# Patient Record
Sex: Male | Born: 1985 | Race: Black or African American | Hispanic: No | Marital: Married | Smoking: Current every day smoker
Health system: Southern US, Community
[De-identification: ages and names within clinical notes are randomized; demographics above are authoritative.]

## PROBLEM LIST (undated history)

## (undated) DIAGNOSIS — J45909 Unspecified asthma, uncomplicated: Secondary | ICD-10-CM

## (undated) DIAGNOSIS — I10 Essential (primary) hypertension: Secondary | ICD-10-CM

## (undated) HISTORY — PX: CYST EXCISION: SHX5701

---

## 2009-10-15 ENCOUNTER — Emergency Department: Payer: Self-pay | Admitting: Emergency Medicine

## 2014-12-07 ENCOUNTER — Emergency Department: Payer: Self-pay | Admitting: Emergency Medicine

## 2014-12-07 LAB — URINALYSIS, COMPLETE
BACTERIA: NONE SEEN
Bilirubin,UR: NEGATIVE
Glucose,UR: NEGATIVE mg/dL (ref 0–75)
Ketone: NEGATIVE
Leukocyte Esterase: NEGATIVE
NITRITE: NEGATIVE
PH: 6 (ref 4.5–8.0)
Protein: NEGATIVE
RBC,UR: 201 /HPF (ref 0–5)
SPECIFIC GRAVITY: 1.027 (ref 1.003–1.030)
Squamous Epithelial: <1
WBC UR: 1 /HPF (ref 0–5)

## 2015-07-28 ENCOUNTER — Emergency Department
Admission: EM | Admit: 2015-07-28 | Discharge: 2015-07-28 | Disposition: A | Discharge status: Home / Self Care | Payer: Self-pay | Attending: Emergency Medicine | Admitting: Emergency Medicine

## 2015-07-28 ENCOUNTER — Encounter: Payer: Self-pay | Admitting: Emergency Medicine

## 2015-07-28 DIAGNOSIS — L0201 Cutaneous abscess of face: Secondary | ICD-10-CM | POA: Insufficient documentation

## 2015-07-28 DIAGNOSIS — Z720 Tobacco use: Secondary | ICD-10-CM | POA: Insufficient documentation

## 2015-07-28 DIAGNOSIS — L0291 Cutaneous abscess, unspecified: Secondary | ICD-10-CM

## 2015-07-28 HISTORY — DX: Unspecified asthma, uncomplicated: J45.909

## 2015-07-28 MED ORDER — SULFAMETHOXAZOLE-TRIMETHOPRIM 800-160 MG PO TABS: 1.0000 | ORAL_TABLET | Freq: Two times a day (BID) | ORAL | Status: DC

## 2015-07-28 NOTE — ED Provider Notes (Signed)
[facility] Emergency Department Provider Note  ____________________________________________  Time seen: On arrival  I have reviewed the triage vital signs and the nursing notes.   HISTORY  Chief Complaint Abscess    HPI Krista Som is a 29 y.o. male who presents with an abscess on the right cheek. He reports has been there for approximately 2 weeks. Today it ruptured and drained white fluid. He came to get it checked out. He denies fevers chills. He has not been on antibiotics. No intraoral symptoms.    Past Medical History  Diagnosis Date  . Asthma     There are no active problems to display for this patient.   No past surgical history on file.  No current outpatient prescriptions on file.  Allergies Review of patient's allergies indicates no known allergies.  No family history on file.  Social History History  Substance Use Topics  . Smoking status: Current Every Day Smoker  . Smokeless tobacco: Not on file  . Alcohol Use: No    Review of Systems  Constitutional: Negative for fever. Eyes: Negative for visual changes. ENT: Negative for sore throat   Musculoskeletal: Negative for back pain. Skin: Positive for abscess Neurological: Negative for headaches or focal weakness   ____________________________________________   PHYSICAL EXAM:  VITAL SIGNS: ED Triage Vitals  Enc Vitals Group     BP 07/28/15 1052 145/76 mmHg     Pulse Rate 07/28/15 1052 84     Resp 07/28/15 1052 16     Temp 07/28/15 1052 98.7 F (37.1 C)     Temp Source 07/28/15 1052 Oral     SpO2 07/28/15 1052 97 %     Weight 07/28/15 1052 170 lb (77.111 kg)     Height 07/28/15 1052  (1.778 m)     Head Cir --      Peak Flow --      Pain Score 07/28/15 1053 4     Pain Loc --      Pain Edu? --      Excl. in GC? --      Constitutional: Alert and oriented. Well appearing and in no distress. Eyes: Conjunctivae are normal.  ENT   Head:  Normocephalic and atraumatic.   Mouth/Throat: Mucous membranes are moist. No intraoral swelling, normal pharynx Cardiovascular: Normal rate, regular rhythm.  Respiratory: Normal respiratory effort without tachypnea nor retractions.  Gastrointestinal: Soft and non-tender in all quadrants. No distention. There is no CVA tenderness. Musculoskeletal: Nontender with normal range of motion in all extremities. Neurologic:  Normal speech and language. No gross focal neurologic deficits are appreciated. Skin:  Skin is warm, dry and intact. Right cheek shallow abscess with drainage approximately 1 x 2 cm Psychiatric: Mood and affect are normal. Patient exhibits appropriate insight and judgment.  ____________________________________________    LABS (pertinent positives/negatives)  Labs Reviewed - No data to display  ____________________________________________     ____________________________________________    RADIOLOGY I have personally reviewed any xrays that were ordered on this patient: None  ____________________________________________   PROCEDURES  Procedure(s) performed: none   ____________________________________________   INITIAL IMPRESSION / ASSESSMENT AND PLAN / ED COURSE  Pertinent labs & imaging results that were available during my care of the patient were reviewed by me and considered in my medical decision making (see chart for details).  Abscess appears to be draining well. No need for an I&D at this point. We will place patient on antibiotics given some mild surrounding inflammation. Return  precautions discussed  ____________________________________________   FINAL CLINICAL IMPRESSION(S) / ED DIAGNOSES  Final diagnoses:  Abscess     Jene Every, MD 07/28/15 1122

## 2015-07-28 NOTE — Discharge Instructions (Signed)

## 2015-07-28 NOTE — ED Notes (Signed)
Pt reports abscess to right cheek x2 weeks, reports it "bust" today. Pt reports here to get it drained.

## 2015-09-25 ENCOUNTER — Emergency Department
Admission: EM | Admit: 2015-09-25 | Discharge: 2015-09-25 | Disposition: A | Discharge status: Home / Self Care | Payer: Self-pay | Attending: Emergency Medicine | Admitting: Emergency Medicine

## 2015-09-25 ENCOUNTER — Encounter: Payer: Self-pay | Admitting: Emergency Medicine

## 2015-09-25 DIAGNOSIS — Y288XXA Contact with other sharp object, undetermined intent, initial encounter: Secondary | ICD-10-CM | POA: Insufficient documentation

## 2015-09-25 DIAGNOSIS — Y998 Other external cause status: Secondary | ICD-10-CM | POA: Insufficient documentation

## 2015-09-25 DIAGNOSIS — Z79899 Other long term (current) drug therapy: Secondary | ICD-10-CM | POA: Insufficient documentation

## 2015-09-25 DIAGNOSIS — Y9289 Other specified places as the place of occurrence of the external cause: Secondary | ICD-10-CM | POA: Insufficient documentation

## 2015-09-25 DIAGNOSIS — IMO0002 Reserved for concepts with insufficient information to code with codable children: Secondary | ICD-10-CM

## 2015-09-25 DIAGNOSIS — Z720 Tobacco use: Secondary | ICD-10-CM | POA: Insufficient documentation

## 2015-09-25 DIAGNOSIS — Z23 Encounter for immunization: Secondary | ICD-10-CM | POA: Insufficient documentation

## 2015-09-25 DIAGNOSIS — S61217A Laceration without foreign body of left little finger without damage to nail, initial encounter: Secondary | ICD-10-CM | POA: Insufficient documentation

## 2015-09-25 DIAGNOSIS — Y9389 Activity, other specified: Secondary | ICD-10-CM | POA: Insufficient documentation

## 2015-09-25 MED ORDER — BACITRACIN ZINC 500 UNIT/GM EX OINT
TOPICAL_OINTMENT | CUTANEOUS | Status: AC
  Filled 2015-09-25: qty 0.9

## 2015-09-25 MED ORDER — IBUPROFEN 800 MG PO TABS: 800.0000 mg | ORAL_TABLET | Freq: Three times a day (TID) | ORAL | Status: DC | PRN

## 2015-09-25 MED ORDER — BACITRACIN ZINC 500 UNIT/GM EX OINT: TOPICAL_OINTMENT | Freq: Two times a day (BID) | CUTANEOUS | Status: DC

## 2015-09-25 MED ORDER — TETANUS-DIPHTH-ACELL PERTUSSIS 5-2.5-18.5 LF-MCG/0.5 IM SUSP
0.5000 mL | Freq: Once | INTRAMUSCULAR | Status: AC
  Administered 2015-09-25: 0.5 mL via INTRAMUSCULAR
  Filled 2015-09-25: qty 0.5

## 2015-09-25 MED ORDER — IBUPROFEN 800 MG PO TABS
800.0000 mg | ORAL_TABLET | Freq: Once | ORAL | Status: AC
  Administered 2015-09-25: 800 mg via ORAL
  Filled 2015-09-25: qty 1

## 2015-09-25 NOTE — Discharge Instructions (Signed)
Laceration Care, Adult A laceration is a cut that goes through all layers of the skin. The cut goes into the tissue beneath the skin. HOME CARE For stitches (sutures) or staples:  Keep the cut clean and dry.  If you have a bandage (dressing), change it at least once a day. Change the bandage if it gets wet or dirty, or as told by your doctor.  Wash the cut with soap and water 2 times a day. Rinse the cut with water. Pat it dry with a clean towel.  Put a thin layer of medicated cream on the cut as told by your doctor.  You may shower after the first 24 hours. Do not soak the cut in water until the stitches are removed.  Only take medicines as told by your doctor.  Have your stitches or staples removed as told by your doctor. For skin adhesive strips:  Keep the cut clean and dry.  Do not get the strips wet. You may take a bath, but be careful to keep the cut dry.  If the cut gets wet, pat it dry with a clean towel.  The strips will fall off on their own. Do not remove the strips that are still stuck to the cut. For wound glue:  You may shower or take baths. Do not soak or scrub the cut. Do not swim. Avoid heavy sweating until the glue falls off on its own. After a shower or bath, pat the cut dry with a clean towel.  Do not put medicine on your cut until the glue falls off.  If you have a bandage, do not put tape over the glue.  Avoid lots of sunlight or tanning lamps until the glue falls off. Put sunscreen on the cut for the first year to reduce your scar.  The glue will fall off on its own. Do not pick at the glue. You may need a tetanus shot if:  You cannot remember when you had your last tetanus shot.  You have never had a tetanus shot. If you need a tetanus shot and you choose not to have one, you may get tetanus. Sickness from tetanus can be serious. GET HELP RIGHT AWAY IF:   Your pain does not get better with medicine.  Your arm, hand, leg, or foot loses feeling  (numbness) or changes color.  Your cut is bleeding.  Your joint feels weak, or you cannot use your joint.  You have painful lumps on your body.  Your cut is red, puffy (swollen), or painful.  You have a red line on the skin near the cut.  You have yellowish-white fluid (pus) coming from the cut.  You have a fever.  You have a bad smell coming from the cut or bandage.  Your cut breaks open before or after stitches are removed.  You notice something coming out of the cut, such as wood or glass.  You cannot move a finger or toe. MAKE SURE YOU:   Understand these instructions.  Will watch your condition.  Will get help right away if you are not doing well or get worse. Document Released: 05/31/2008 Document Revised: 03/06/2012 Document Reviewed: 06/08/2011 [facility] Patient Information 2015 [place], [place]. This information is not intended to replace advice given to you by your health care provider. Make sure you discuss any questions you have with your health care provider.   Pain medicine as needed. Watch for signs of infection. The area clean and protected. Return in approximately  10-12 days for suture removal. return sooner for any concerns.

## 2015-09-25 NOTE — ED Provider Notes (Signed)
[facility] Emergency Department Provider Note  ____________________________________________  Time seen: Approximately 5:39 PM  I have reviewed the triage vital signs and the nursing notes.   HISTORY  Chief Complaint Laceration    HPI Joel Waller is a 29 y.o. male who presents with a laceration to the base of the left fifth finger. Cut the finger with a piece of metal. he is not remember his last tetanus shot. No loss of function to the left fifth finger. He has stopped.   Past Medical History  Diagnosis Date  . Asthma     There are no active problems to display for this patient.   History reviewed. No pertinent past surgical history.  Current Outpatient Rx  Name  Route  Sig  Dispense  Refill  . ibuprofen (ADVIL,MOTRIN) 800 MG tablet   Oral   Take 1 tablet (800 mg total) by mouth every 8 (eight) hours as needed.   15 tablet   0   . sulfamethoxazole-trimethoprim (BACTRIM DS,SEPTRA DS) 800-160 MG per tablet   Oral   Take 1 tablet by mouth 2 (two) times daily.   14 tablet   0     Allergies Review of patient's allergies indicates no known allergies.  No family history on file.  Social History Social History  Substance Use Topics  . Smoking status: Current Every Day Smoker  . Smokeless tobacco: None  . Alcohol Use: No    Review of Systems Constitutional: No fever/chills Eyes: No visual changes. ENT: No sore throat. Cardiovascular: Denies chest pain. Respiratory: Denies shortness of breath.10-point ROS otherwise negative.  ____________________________________________   PHYSICAL EXAM:  VITAL SIGNS: ED Triage Vitals  Enc Vitals Group     BP 09/25/15 1655 162/89 mmHg     Pulse Rate 09/25/15 1655 69     Resp 09/25/15 1655 20     Temp 09/25/15 1655 97.8 F (36.6 C)     Temp Source 09/25/15 1655 Oral     SpO2 09/25/15 1655 98 %     Weight 09/25/15 1655 180 lb (81.647 kg)     Height 09/25/15 1655  (1.778 m)     Head  Cir --      Peak Flow --      Pain Score 09/25/15 1651 4     Pain Loc --      Pain Edu? --      Excl. in GC? --     Constitutional: Alert and oriented. Well appearing and in no acute distress. Eyes: Conjunctivae are normal. EOMI. Head: Atraumatic. Neck: supple Cardiovascular: Normal rate, regular rhythm. Grossly normal heart sounds.  Good peripheral circulation. Respiratory: Normal respiratory effort.  No retractions. Lungs CTAB. Skin:  Skin is warm, dry and intact. No rash noted. One centimeter laceration to the proximal surface of the left fifth finger. Psychiatric: Mood and affect are normal. Speech and behavior are normal.  ____________________________________________   LABS (all labs ordered are listed, but only abnormal results are displayed)  Labs Reviewed - No data to display ____________________________________________  EKG    ____________________________________________  RADIOLOGY    ____________________________________________   PROCEDURES  Procedure(s) performed: LACERATION REPAIR Performed by: Ignacia Bayley Authorized by: Ignacia Bayley Consent: Verbal consent obtained. Risks and benefits: risks, benefits and alternatives were discussed Consent given by: patient Patient identity confirmed: provided demographic data Prepped and Draped in normal sterile fashion Wound explored  Laceration Location: The fifth  Laceration Length: 1cm  No Foreign Bodies seen or palpated  Anesthesia: local infiltration  Local anesthetic: lidocaine 1 % with epinephrine  Anesthetic total: 2 ml  Irrigation method: syringe Amount of cleaning: standard  Skin closure: 4.0 nylon  Number of sutures: 3  Technique: simple interrupted  Patient tolerance: Patient tolerated the procedure well with no immediate complications.   Critical Care performed: No  ____________________________________________   INITIAL IMPRESSION / ASSESSMENT AND PLAN / ED  COURSE  Pertinent labs & imaging results that were available during my care of the patient were reviewed by me and considered in my medical decision making (see chart for details).  29 year old male with history of laceration to the left fifth finger. Closure as per procedure above. Tolerated well. Will return in 10-12 days for suture removal. Instructed in laceration care ____________________________________________   FINAL CLINICAL IMPRESSION(S) / ED DIAGNOSES  Final diagnoses:  Laceration   .  Ignacia Bayley, PA-C 09/25/15 1745  Ignacia Bayley, PA-C 09/25/15 1856  Phineas Semen, MD 09/25/15 2059

## 2015-09-25 NOTE — ED Notes (Signed)
Laceration to left 5th finger 

## 2017-12-20 ENCOUNTER — Encounter: Payer: Self-pay | Admitting: Emergency Medicine

## 2017-12-20 ENCOUNTER — Other Ambulatory Visit: Payer: Self-pay

## 2017-12-20 ENCOUNTER — Emergency Department
Admission: EM | Admit: 2017-12-20 | Discharge: 2017-12-20 | Disposition: A | Discharge status: Home / Self Care | Payer: Self-pay | Attending: Emergency Medicine | Admitting: Emergency Medicine

## 2017-12-20 DIAGNOSIS — F17200 Nicotine dependence, unspecified, uncomplicated: Secondary | ICD-10-CM | POA: Insufficient documentation

## 2017-12-20 DIAGNOSIS — J45909 Unspecified asthma, uncomplicated: Secondary | ICD-10-CM | POA: Insufficient documentation

## 2017-12-20 DIAGNOSIS — J111 Influenza due to unidentified influenza virus with other respiratory manifestations: Secondary | ICD-10-CM | POA: Insufficient documentation

## 2017-12-20 DIAGNOSIS — Z79899 Other long term (current) drug therapy: Secondary | ICD-10-CM | POA: Insufficient documentation

## 2017-12-20 DIAGNOSIS — I10 Essential (primary) hypertension: Secondary | ICD-10-CM | POA: Insufficient documentation

## 2017-12-20 HISTORY — DX: Essential (primary) hypertension: I10

## 2017-12-20 LAB — INFLUENZA PANEL BY PCR (TYPE A & B)
INFLAPCR: POSITIVE — AB
Influenza B By PCR: NEGATIVE

## 2017-12-20 MED ORDER — ACETAMINOPHEN 325 MG PO TABS
650.0000 mg | ORAL_TABLET | Freq: Once | ORAL | Status: AC | PRN
  Administered 2017-12-20: 650 mg via ORAL
  Filled 2017-12-20: qty 2

## 2017-12-20 MED ORDER — ACETAMINOPHEN-CODEINE #3 300-30 MG PO TABS: 1.0000 | ORAL_TABLET | Freq: Four times a day (QID) | ORAL | 0 refills | Status: DC | PRN

## 2017-12-20 MED ORDER — BENZONATATE 100 MG PO CAPS: ORAL_CAPSULE | ORAL | 0 refills | Status: DC

## 2017-12-20 MED ORDER — FLUTICASONE PROPIONATE 50 MCG/ACT NA SUSP: 2.0000 | Freq: Every day | NASAL | 0 refills | Status: DC

## 2017-12-20 NOTE — Discharge Instructions (Signed)
You have tested positive for influenza (flu). Continue to monitor and treat any fevers with Tylenol and ibuprofen. Drink plenty of fluids, even if not eating much, to prevent dehydration. Follow-up with your provider or return for chest pain or shortness of breath.

## 2017-12-20 NOTE — ED Triage Notes (Signed)
Arrives with c/o sinus congestion, body aches, fever x 1 day.  Has not medicated for fever.

## 2017-12-20 NOTE — ED Provider Notes (Signed)
[facility] Emergency Department Provider Note ____________________________________________  Time seen: 1130  I have reviewed the triage vital signs and the nursing notes.  HISTORY  Chief Complaint  Fever  HPI Joel Waller is a 31 y.o. male presents to the ED accompanied by his friend for evaluation of sudden onset of fevers, cough, congestion.  Patient reports symptoms that began yesterday.  He has not any medication in the interim for symptom relief.  He also has not received the seasonal flu vaccine.  He reports generalized body aches, sinus congestion, and cough.  He denies any nausea, vomiting, chest pain, or diarrhea.  Past Medical History:  Diagnosis Date  . Asthma   . Hypertension     There are no active problems to display for this patient.   History reviewed. No pertinent surgical history.  Prior to Admission medications   Medication Sig Start Date End Date Taking? Authorizing Provider  acetaminophen-codeine (TYLENOL #3) 300-30 MG tablet Take 1 tablet by mouth every 6 (six) hours as needed for moderate pain. 12/20/17   Aylyn Wenzler, Charlesetta IvoryJenise V Bacon, PA-C  benzonatate (TESSALON PERLES) 100 MG capsule Take 1-2 tabs TID prn cough 12/20/17   Chamari Cutbirth, Charlesetta IvoryJenise V Bacon, PA-C  fluticasone (FLONASE) 50 MCG/ACT nasal spray Place 2 sprays into both nostrils daily. 12/20/17   Cathren Sween, Charlesetta IvoryJenise V Bacon, PA-C  ibuprofen (ADVIL,MOTRIN) 800 MG tablet Take 1 tablet (800 mg total) by mouth every 8 (eight) hours as needed. 09/25/15   Ignacia Bayleyumey, Robert, PA-C  sulfamethoxazole-trimethoprim (BACTRIM DS,SEPTRA DS) 800-160 MG per tablet Take 1 tablet by mouth 2 (two) times daily. 07/28/15   Jene EveryKinner, Robert, MD    Allergies Patient has no known allergies.  No family history on file.  Social History Social History   Tobacco Use  . Smoking status: Current Every Day Smoker  . Smokeless tobacco: Never Used  Substance Use Topics  . Alcohol use: No  . Drug use: No    Review of  Systems  Constitutional: Positive for fever. Eyes: Negative for visual changes. ENT: Negative for sore throat.  Reports sinus congestion. Cardiovascular: Negative for chest pain. Respiratory: Negative for shortness of breath. Gastrointestinal: Negative for abdominal pain, vomiting and diarrhea. Genitourinary: Negative for dysuria. Musculoskeletal: Negative for back pain.  Reports generalized body aches. Skin: Negative for rash. Neurological: Negative for headaches, focal weakness or numbness. ____________________________________________  PHYSICAL EXAM:  VITAL SIGNS: ED Triage Vitals  Enc Vitals Group     BP 12/20/17 1132 (!) 157/86     Pulse Rate 12/20/17 1132 (!) 114     Resp 12/20/17 1132 (!) 22     Temp 12/20/17 1132 (!) 102.3 F (39.1 C)     Temp Source 12/20/17 1132 Oral     SpO2 12/20/17 1132 98 %     Weight 12/20/17 1121 176 lb (79.8 kg)     Height 12/20/17 1121 5\' 10"  (1.778 m)     Head Circumference --      Peak Flow --      Pain Score 12/20/17 1121 8     Pain Loc --      Pain Edu? --      Excl. in GC? --     Constitutional: Alert and oriented. Well appearing and in no distress. Head: Normocephalic and atraumatic. Eyes: Conjunctivae are normal. PERRL. Normal extraocular movements Ears: Canals clear. TMs intact bilaterally. Nose: No congestion/rhinorrhea/epistaxis. Mouth/Throat: Mucous membranes are moist. Neck: Supple. No thyromegaly. Hematological/Lymphatic/Immunological: No cervical lymphadenopathy. Cardiovascular: Normal rate, regular rhythm.  Normal distal pulses. Respiratory: Normal respiratory effort. No wheezes/rales/rhonchi. Gastrointestinal: Soft and nontender. No distention. Musculoskeletal: Nontender with normal range of motion in all extremities.  Neurologic:  Normal gait without ataxia. Normal speech and language. No gross focal neurologic deficits are appreciated. ____________________________________________   LABS (pertinent  positives/negatives)  Labs Reviewed  INFLUENZA PANEL BY PCR (TYPE A & B) - Abnormal; Notable for the following components:      Result Value   Influenza A By PCR POSITIVE (*)    All other components within normal limits  ____________________________________________  PROCEDURES  Procedures Tylenol 650 mg PO ____________________________________________  INITIAL IMPRESSION / ASSESSMENT AND PLAN / ED COURSE  ED evaluation of sudden onset of fevers and body aches.  His lab is consistent with an influenza A.  He is discharged with prescriptions for Tessalon Perles, Flonase, and Tylenol with codeine.  He will continue to monitor and treat fevers as well as hydrate to prevent dehydration.  Return precautions have been reviewed. ____________________________________________  FINAL CLINICAL IMPRESSION(S) / ED DIAGNOSES  Final diagnoses:  Influenza      Karmen StabsMenshew, Charlesetta IvoryJenise V Bacon, PA-C 12/20/17 1245    Myrna BlazerSchaevitz, David Matthew, MD 12/20/17 [phone]

## 2018-01-07 ENCOUNTER — Emergency Department
Admission: EM | Admit: 2018-01-07 | Discharge: 2018-01-07 | Disposition: A | Discharge status: Home / Self Care | Payer: Self-pay | Attending: Emergency Medicine | Admitting: Emergency Medicine

## 2018-01-07 ENCOUNTER — Encounter: Payer: Self-pay | Admitting: Emergency Medicine

## 2018-01-07 ENCOUNTER — Other Ambulatory Visit: Payer: Self-pay

## 2018-01-07 DIAGNOSIS — J45909 Unspecified asthma, uncomplicated: Secondary | ICD-10-CM | POA: Insufficient documentation

## 2018-01-07 DIAGNOSIS — K047 Periapical abscess without sinus: Secondary | ICD-10-CM | POA: Insufficient documentation

## 2018-01-07 DIAGNOSIS — Z79899 Other long term (current) drug therapy: Secondary | ICD-10-CM | POA: Insufficient documentation

## 2018-01-07 DIAGNOSIS — I10 Essential (primary) hypertension: Secondary | ICD-10-CM | POA: Insufficient documentation

## 2018-01-07 DIAGNOSIS — F17200 Nicotine dependence, unspecified, uncomplicated: Secondary | ICD-10-CM | POA: Insufficient documentation

## 2018-01-07 MED ORDER — AMOXICILLIN 500 MG PO CAPS: 500.0000 mg | ORAL_CAPSULE | Freq: Three times a day (TID) | ORAL | 0 refills | Status: DC

## 2018-01-07 MED ORDER — IBUPROFEN 800 MG PO TABS: 800.0000 mg | ORAL_TABLET | Freq: Three times a day (TID) | ORAL | 0 refills | Status: DC | PRN

## 2018-01-07 MED ORDER — TRAMADOL HCL 50 MG PO TABS: 50.0000 mg | ORAL_TABLET | Freq: Four times a day (QID) | ORAL | 0 refills | Status: DC | PRN

## 2018-01-07 NOTE — Discharge Instructions (Signed)
Follow-up from his dental clinics. OPTIONS FOR DENTAL FOLLOW UP CARE  [place] Department of Health and Human Services - Local Safety Net Dental Clinics [email or web address]://www.ncdhhs.gov/dph/oralhealth/services/safetynetclinics.htm   [facility] [phone])  [facility] [phone])  [facility] [phone])  [facility] [phone])  [facility] [phone]) This clinic caters to the indigent population and is on a lottery system. Location: [employer], [address], [address], [place] Clinic Hours: Wednesdays from 6pm - 9pm, patients seen by a lottery system. For dates, call or go to [email or web address] Services: Cleanings, fillings and simple extractions. Payment Options: DENTAL WORK IS FREE OF CHARGE. Bring proof of income or support. Best way to get seen: Arrive at 5:15 pm - this is a lottery, NOT first come/first serve, so arriving earlier will not increase your chances of being seen.     [facility] [phone] Select option 1 for emergencies   Location: [facility], [address], [address], [place] [facility] Hours: No walk-ins accepted - call the day before to schedule an appointment. Check in times are 9:30 am and 1:30 pm. Services: Simple extractions, temporary fillings, pulpectomy/pulp debridement, uncomplicated abscess drainage. Payment Options: PAYMENT IS DUE AT THE TIME OF SERVICE.  Fee is usually $100-200, additional surgical procedures (e.g. abscess drainage) may be extra. Cash, checks, Visa/MasterCard accepted.  Can file Medicaid if patient is covered for dental - patient should call case worker to check. No discount for [facility] patients. Best way to get seen: MUST call the day before and get onto the schedule. Can usually be seen the next 1-2 days. No walk-ins accepted.     [facility] [phone]   Location: [facility], [address], [place] Clinic Hours: M, W, Th, F 8am or 1:30pm, Tues 9a or 1:30 - first come/first served. Services: Simple extractions, temporary fillings, uncomplicated abscess drainage.  You do not need to be an [place] resident. Payment Options: PAYMENT IS DUE AT THE TIME OF SERVICE. Dental insurance, otherwise sliding scale - bring proof of income or support. Depending on income and treatment needed, cost is usually $50-200. Best way to get seen: Arrive early as it is first come/first served.     [facility] [phone]   Location: [address] Clinic Hours: Mon-Thu 8a-5p Services: Most basic dental services including extractions and fillings. Payment Options: PAYMENT IS DUE AT THE TIME OF SERVICE. Sliding scale, up to 50% off - bring proof if income or support. Medicaid with dental option accepted. Best way to get seen: Call to schedule an appointment, can usually be seen within 2 weeks OR they will try to see walk-ins - show up at 8a or 2p (you may have to wait).     [facility] [phone] [place] RESIDENTS ONLY   Location: [facility], [address], [place], [place] [address] Clinic Hours: By appointment only. Monday - Thursday 8am-5pm, Friday 8am-12pm Services: Cleanings, fillings, extractions. Payment Options: PAYMENT IS DUE AT THE TIME OF SERVICE. Cash, Visa or MasterCard. Sliding scale - $30 minimum per service. Best way to get seen: Come in to office, complete packet and make an appointment - need proof of income or support monies for each household member and proof of [place] residence. Usually takes about a month to get in.     [facility] [phone]  Location: [address], [place] Clinic Hours: Walk-in Urgent Care Dental Services are  offered Monday-Friday mornings only. The numbers of emergencies accepted daily is limited to the number of providers available. Maximum 15 - Mondays, Wednesdays & Thursdays Maximum 10 - Tuesdays & Fridays Services: You do not need to be a [facility] resident to be seen for a dental emergency. Emergencies are defined as pain, swelling, abnormal bleeding, or dental trauma. Walkins will receive x-rays if needed. NOTE: Dental cleaning is not an emergency. Payment Options: PAYMENT IS DUE AT THE TIME OF SERVICE. Minimum co-pay is $40.00 for uninsured patients. Minimum co-pay is $3.00 for Medicaid with dental coverage. Dental Insurance is accepted and must be presented at time of visit. Medicare does not cover dental. Forms of payment: Cash, credit card, checks. Best way to get seen: If not previously registered with the clinic, walk-in dental registration begins at 7:15 am and is on a first come/first serve basis. If previously registered with the clinic, call to make an appointment.     [facility] [place] ONLY   Location: [address], [place], [place] Clinic Hours: Mon-Thu 10a-2p Services: Extractions only! Payment Options: FREE (donations accepted) - bring proof of income or support Best way to get seen: Call and schedule an appointment OR come at 8am on the 1st Monday of every month (except for holidays) when it is first come/first served.     [facility] [phone]   Location: [place], [place] Clinic Hours: Friday mornings Services, Payment Options, Best way to get seen: Call for info

## 2018-01-07 NOTE — ED Provider Notes (Signed)
[facility] Emergency Department Provider Note   ____________________________________________   First MD Initiated Contact with Patient 01/07/18 1016     (approximate)  I have reviewed the triage vital signs and the nursing notes.   HISTORY  Chief Complaint Abscess    HPI Joel Waller is a 32 y.o. male patient complained of dental pain for 2-3 days.  Patient awakened this morning with left lateral facial edema.  Patient rates his pain as a 6/10.  Patient described the pain as "achy".  No palliative measure for complaints.  Past Medical History:  Diagnosis Date  . Asthma   . Hypertension     There are no active problems to display for this patient.   History reviewed. No pertinent surgical history.  Prior to Admission medications   Medication Sig Start Date End Date Taking? Authorizing Provider  acetaminophen-codeine (TYLENOL #3) 300-30 MG tablet Take 1 tablet by mouth every 6 (six) hours as needed for moderate pain. 12/20/17   Menshew, Charlesetta Ivory, PA-C  amoxicillin (AMOXIL) 500 MG capsule Take 1 capsule (500 mg total) by mouth 3 (three) times daily. 01/07/18   Joni Reining, PA-C  benzonatate (TESSALON PERLES) 100 MG capsule Take 1-2 tabs TID prn cough 12/20/17   Menshew, Charlesetta Ivory, PA-C  fluticasone (FLONASE) 50 MCG/ACT nasal spray Place 2 sprays into both nostrils daily. 12/20/17   Menshew, Charlesetta Ivory, PA-C  ibuprofen (ADVIL,MOTRIN) 800 MG tablet Take 1 tablet (800 mg total) by mouth every 8 (eight) hours as needed. 09/25/15   Ignacia Bayley, PA-C  ibuprofen (ADVIL,MOTRIN) 800 MG tablet Take 1 tablet (800 mg total) by mouth every 8 (eight) hours as needed. 01/07/18   Joni Reining, PA-C  sulfamethoxazole-trimethoprim (BACTRIM DS,SEPTRA DS) 800-160 MG per tablet Take 1 tablet by mouth 2 (two) times daily. 07/28/15   Jene Every, MD  traMADol (ULTRAM) 50 MG tablet Take 1 tablet (50 mg total) by mouth every 6 (six) hours as needed for  moderate pain. 01/07/18   Joni Reining, PA-C    Allergies Patient has no known allergies.  No family history on file.  Social History Social History   Tobacco Use  . Smoking status: Current Every Day Smoker  . Smokeless tobacco: Never Used  Substance Use Topics  . Alcohol use: No  . Drug use: No    Review of Systems Constitutional: No fever/chills Eyes: No visual changes. ENT: No sore throat.  The pain Cardiovascular: Denies chest pain. Respiratory: Denies shortness of breath. Gastrointestinal: No abdominal pain.  No nausea, no vomiting.  No diarrhea.  No constipation. Genitourinary: Negative for dysuria. Musculoskeletal: Negative for back pain. Skin: Negative for rash. Neurological: Negative for headaches, focal weakness or numbness. Endocrine:Hypertension ____________________________________________   PHYSICAL EXAM:  VITAL SIGNS: ED Triage Vitals  Enc Vitals Group     BP 01/07/18 1013 (!) 142/88     Pulse Rate 01/07/18 1013 68     Resp 01/07/18 1013 18     Temp 01/07/18 1013 98.6 F (37 C)     Temp Source 01/07/18 1013 Oral     SpO2 01/07/18 1013 100 %     Weight 01/07/18 1005 176 lb (79.8 kg)     Height --      Head Circumference --      Peak Flow --      Pain Score 01/07/18 1005 8     Pain Loc --      Pain Edu? --  Excl. in GC? --    Constitutional: Alert and oriented. Well appearing and in no acute distress. Cardiovascular: Normal rate, regular rhythm. Grossly normal heart sounds.  Good peripheral circulation. Respiratory: Normal respiratory effort.  No retractions. Lungs CTAB. Musculoskeletal: No lower extremity tenderness nor edema.  No joint effusions. Neurologic:  Normal speech and language. No gross focal neurologic deficits are appreciated. No gait instability. Skin:  Skin is warm, dry and intact. No rash noted. Psychiatric: Mood and affect are normal. Speech and behavior are normal.  ____________________________________________     LABS (all labs ordered are listed, but only abnormal results are displayed)  Labs Reviewed - No data to display ____________________________________________  EKG   ____________________________________________  RADIOLOGY  No results found.  ____________________________________________   PROCEDURES  Procedure(s) performed: None  Procedures  Critical Care performed: No  ____________________________________________   INITIAL IMPRESSION / ASSESSMENT AND PLAN / ED COURSE  As part of my medical decision making, I reviewed the following data within the electronic MEDICAL RECORD NUMBER    Dental pain secondary to dental abscess.  Patient given discharge care instructions.  Patient advised to follow-up from [facility] clinics.  Take medication as directed.      ____________________________________________   FINAL CLINICAL IMPRESSION(S) / ED DIAGNOSES  Final diagnoses:  Dental abscess     ED Discharge Orders        Ordered    amoxicillin (AMOXIL) 500 MG capsule  3 times daily     01/07/18 1022    traMADol (ULTRAM) 50 MG tablet  Every 6 hours PRN     01/07/18 1022    ibuprofen (ADVIL,MOTRIN) 800 MG tablet  Every 8 hours PRN     01/07/18 1022       Note:  This document was prepared using Dragon voice recognition software and may include unintentional dictation errors.    Joni ReiningSmith, Malasia Torain K, PA-C 01/07/18 1031    Minna AntisPaduchowski, Kevin, MD 01/07/18 [phone]

## 2018-01-07 NOTE — ED Triage Notes (Signed)
Pt with possible dental abscess

## 2018-01-07 NOTE — ED Notes (Signed)
Pt states noticed abscess on right side of mouth last night, tried warm compresses, worse this morning.

## 2018-12-05 ENCOUNTER — Emergency Department: Payer: 59

## 2018-12-05 ENCOUNTER — Emergency Department
Admission: EM | Admit: 2018-12-05 | Discharge: 2018-12-05 | Disposition: A | Discharge status: Home / Self Care | Payer: 59 | Attending: Emergency Medicine | Admitting: Emergency Medicine

## 2018-12-05 ENCOUNTER — Encounter: Payer: Self-pay | Admitting: Emergency Medicine

## 2018-12-05 ENCOUNTER — Other Ambulatory Visit: Payer: Self-pay

## 2018-12-05 DIAGNOSIS — F17200 Nicotine dependence, unspecified, uncomplicated: Secondary | ICD-10-CM | POA: Insufficient documentation

## 2018-12-05 DIAGNOSIS — J069 Acute upper respiratory infection, unspecified: Secondary | ICD-10-CM | POA: Diagnosis not present

## 2018-12-05 DIAGNOSIS — J45909 Unspecified asthma, uncomplicated: Secondary | ICD-10-CM | POA: Insufficient documentation

## 2018-12-05 DIAGNOSIS — B349 Viral infection, unspecified: Secondary | ICD-10-CM | POA: Diagnosis not present

## 2018-12-05 DIAGNOSIS — R05 Cough: Secondary | ICD-10-CM | POA: Insufficient documentation

## 2018-12-05 DIAGNOSIS — I10 Essential (primary) hypertension: Secondary | ICD-10-CM | POA: Diagnosis not present

## 2018-12-05 DIAGNOSIS — B9789 Other viral agents as the cause of diseases classified elsewhere: Secondary | ICD-10-CM

## 2018-12-05 DIAGNOSIS — R079 Chest pain, unspecified: Secondary | ICD-10-CM | POA: Diagnosis present

## 2018-12-05 LAB — BASIC METABOLIC PANEL
Anion gap: 9 (ref 5–15)
BUN: 16 mg/dL (ref 6–20)
CALCIUM: 9.4 mg/dL (ref 8.9–10.3)
CHLORIDE: 106 mmol/L (ref 98–111)
CO2: 22 mmol/L (ref 22–32)
CREATININE: 1.26 mg/dL — AB (ref 0.61–1.24)
GFR calc non Af Amer: >60 mL/min (ref >60)
Glucose, Bld: 89 mg/dL (ref 70–99)
Potassium: 3.6 mmol/L (ref 3.5–5.1)
SODIUM: 137 mmol/L (ref 135–145)

## 2018-12-05 LAB — INFLUENZA PANEL BY PCR (TYPE A & B)
INFLAPCR: NEGATIVE
Influenza B By PCR: NEGATIVE

## 2018-12-05 LAB — CBC
HEMATOCRIT: 45.4 % (ref 39.0–52.0)
Hemoglobin: 15 g/dL (ref 13.0–17.0)
MCH: 28.2 pg (ref 26.0–34.0)
MCHC: 33 g/dL (ref 30.0–36.0)
MCV: 85.5 fL (ref 80.0–100.0)
Platelets: 169 10*3/uL (ref 150–400)
RBC: 5.31 MIL/uL (ref 4.22–5.81)
RDW: 12.9 % (ref 11.5–15.5)
WBC: 5.9 10*3/uL (ref 4.0–10.5)
nRBC: 0 % (ref 0.0–0.2)

## 2018-12-05 LAB — TROPONIN I: Troponin I: <0.03 ng/mL (ref <0.03)

## 2018-12-05 MED ORDER — PREDNISONE 20 MG PO TABS: 20.0000 mg | ORAL_TABLET | Freq: Two times a day (BID) | ORAL | 0 refills | Status: AC

## 2018-12-05 MED ORDER — ALBUTEROL SULFATE HFA 108 (90 BASE) MCG/ACT IN AERS: 2.0000 | INHALATION_SPRAY | Freq: Four times a day (QID) | RESPIRATORY_TRACT | 0 refills | Status: DC | PRN

## 2018-12-05 MED ORDER — BENZONATATE 100 MG PO CAPS
ORAL_CAPSULE | ORAL | 0 refills | Status: DC
Start: 2018-12-05 — End: 2020-09-02

## 2018-12-05 MED ORDER — DEXAMETHASONE SODIUM PHOSPHATE 10 MG/ML IJ SOLN
10.0000 mg | Freq: Once | INTRAMUSCULAR | Status: AC
  Administered 2018-12-05: 10 mg via INTRAMUSCULAR
  Filled 2018-12-05: qty 1

## 2018-12-05 MED ORDER — IPRATROPIUM-ALBUTEROL 0.5-2.5 (3) MG/3ML IN SOLN
3.0000 mL | Freq: Once | RESPIRATORY_TRACT | Status: AC
  Administered 2018-12-05: 3 mL via RESPIRATORY_TRACT
  Filled 2018-12-05: qty 3

## 2018-12-05 NOTE — ED Notes (Signed)
Pt c/o cough with sinus and chest congestion for the past several days with green sputum, states he has been taking thermaflu, sudafed with no relief. States he has a hx of asthma but does not have an inhaler at home. Pt has noted wheezing on assessment.

## 2018-12-05 NOTE — ED Triage Notes (Signed)
Pt reports that he developed a sore throat over the weekend, he has taken many OTC remedies without relief. He has had a productive cough with green phlem. He reports that he is having nasal congestion, chest pain and headache today.

## 2018-12-05 NOTE — Discharge Instructions (Addendum)
Your exam, EKG, labs, chest x-ray, and influenza are all negative. Your symptoms likely represent a viral upper respiratory infection. Take the prescription meds as directed. [facility].

## 2018-12-06 NOTE — ED Provider Notes (Addendum)
[facility] Emergency Department Provider Note ____________________________________________  Time seen: 1206  I have reviewed the triage vital signs and the nursing notes.  HISTORY  Chief Complaint  Chest Pain; Nasal Congestion; and Headache  HPI Joel Waller is a 32 y.o. male who presents to the ED for evaluation of a sore throat, productive cough, nasal congestion, chest pain, and headache.  He describes the onset of symptoms over the weekend.  He denies any sick contacts, recent travel, or other exposures.  Patient has been taking over-the-counter medications without significant benefit to his symptoms.  He denies any frank fevers, chest pain, or vomiting.  Past Medical History:  Diagnosis Date  . Asthma   . Hypertension    There are no active problems to display for this patient.  History reviewed. No pertinent surgical history.  Prior to Admission medications   Medication Sig Start Date End Date Taking? Authorizing Provider  albuterol (PROVENTIL HFA;VENTOLIN HFA) 108 (90 Base) MCG/ACT inhaler Inhale 2 puffs into the lungs every 6 (six) hours as needed for wheezing or shortness of breath. 12/05/18   Tasmine Hipwell, Charlesetta Ivory, PA-C  benzonatate (TESSALON PERLES) 100 MG capsule Take 1-2 tabs TID prn cough 12/05/18   Krystall Kruckenberg, Charlesetta Ivory, PA-C  predniSONE (DELTASONE) 20 MG tablet Take 1 tablet (20 mg total) by mouth 2 (two) times daily with a meal for 5 days. 12/05/18 12/10/18  Enez Monahan, Charlesetta Ivory, PA-C    Allergies Patient has no known allergies.  History reviewed. No pertinent family history.  Social History Social History   Tobacco Use  . Smoking status: Current Every Day Smoker  . Smokeless tobacco: Never Used  Substance Use Topics  . Alcohol use: No  . Drug use: No    Review of Systems  Constitutional: Negative for fever. Eyes: Negative for visual changes. ENT: Positive for sore throat & nasal congestion Cardiovascular: Negative  for chest pain. Respiratory: Positive for shortness of breath & productive cough Gastrointestinal: Negative for abdominal pain, vomiting and diarrhea. Genitourinary: Negative for dysuria. Musculoskeletal: Negative for back pain. Skin: Negative for rash. Neurological: Negative for headaches, focal weakness or numbness. ____________________________________________  PHYSICAL EXAM:  VITAL SIGNS: ED Triage Vitals  Enc Vitals Group     BP 12/05/18 0947 (!) 151/95     Pulse Rate 12/05/18 0947 73     Resp 12/05/18 0947 20     Temp 12/05/18 0947 98.5 F (36.9 C)     Temp Source 12/05/18 0947 Oral     SpO2 12/05/18 0947 99 %     Weight 12/05/18 0948 176 lb (79.8 kg)     Height 12/05/18 0948 5\' 10"  (1.778 m)     Head Circumference --      Peak Flow --      Pain Score 12/05/18 0947 0     Pain Loc --      Pain Edu? --      Excl. in GC? --     Constitutional: Alert and oriented. Well appearing and in no distress. Head: Normocephalic and atraumatic. Eyes: Conjunctivae are normal. PERRL. Normal extraocular movements Ears: Canals clear. TMs intact bilaterally. Nose: No congestion/rhinorrhea/epistaxis. Mouth/Throat: Mucous membranes are moist. Neck: Supple. No thyromegaly. Hematological/Lymphatic/Immunological: No cervical lymphadenopathy. Cardiovascular: Normal rate, regular rhythm. Normal distal pulses. Respiratory: Normal respiratory effort. No rales. Mild end-expiratory wheezes & mild rhonchi noted bilaterally. Gastrointestinal: Soft and nontender. No distention. Musculoskeletal: Nontender with normal range of motion in all extremities.  Neurologic:  Normal gait  without ataxia. Normal speech and language. No gross focal neurologic deficits are appreciated. Skin:  Skin is warm, dry and intact. No rash noted. Psychiatric: Mood and affect are normal. Patient exhibits appropriate insight and judgment. ____________________________________________   LABS (pertinent  positives/negatives)  Labs Reviewed  BASIC METABOLIC PANEL - Abnormal; Notable for the following components:      Result Value   Creatinine, Ser 1.26 (*)    All other components within normal limits  CBC  TROPONIN I  INFLUENZA PANEL BY PCR (TYPE A & B)  ____________________________________________  EKG NSR 73 bpm Normal intervals No STEMI ____________________________________________   RADIOLOGY  CXR  IMPRESSION: No acute disease. ____________________________________________  PROCEDURES  Procedures Decadron 10 mg IM DuoNeb x 1 ____________________________________________  INITIAL IMPRESSION / ASSESSMENT AND PLAN / ED COURSE  Patient with ED evaluation of cough, sore throat, nasal congestion, chest tightness and mild headache presentation.  Patient is reassured by his normal labs, EKG, chest x-ray, and negative influenza screen.  His symptoms represent a viral etiology at this time.  Be treated for a viral URI with prescriptions for prednisone, albuterol, Tessalon Perles.  Patient is encouraged to start over-the-counter cough medicine like dextromethorphan for additional relief.  A work note is provided for today as requested. ____________________________________________  FINAL CLINICAL IMPRESSION(S) / ED DIAGNOSES  Final diagnoses:  Viral URI with cough      Daltyn Degroat, Charlesetta IvoryJenise V Bacon, PA-C 12/06/18 2301    Lissa HoardMenshew, Zalan Shidler V Bacon, PA-C 12/06/18 2301    Emily FilbertWilliams, Jonathan E, MD 12/07/18 1423

## 2020-09-02 ENCOUNTER — Emergency Department
Admission: EM | Admit: 2020-09-02 | Discharge: 2020-09-02 | Disposition: A | Discharge status: Home / Self Care | Payer: Managed Care, Other (non HMO) | Attending: Emergency Medicine | Admitting: Emergency Medicine

## 2020-09-02 ENCOUNTER — Encounter: Payer: Self-pay | Admitting: Emergency Medicine

## 2020-09-02 ENCOUNTER — Other Ambulatory Visit: Payer: Self-pay

## 2020-09-02 DIAGNOSIS — J029 Acute pharyngitis, unspecified: Secondary | ICD-10-CM | POA: Diagnosis not present

## 2020-09-02 DIAGNOSIS — F17200 Nicotine dependence, unspecified, uncomplicated: Secondary | ICD-10-CM | POA: Insufficient documentation

## 2020-09-02 DIAGNOSIS — Z20822 Contact with and (suspected) exposure to covid-19: Secondary | ICD-10-CM | POA: Diagnosis not present

## 2020-09-02 DIAGNOSIS — I10 Essential (primary) hypertension: Secondary | ICD-10-CM | POA: Diagnosis not present

## 2020-09-02 DIAGNOSIS — Z7951 Long term (current) use of inhaled steroids: Secondary | ICD-10-CM | POA: Insufficient documentation

## 2020-09-02 DIAGNOSIS — J45909 Unspecified asthma, uncomplicated: Secondary | ICD-10-CM | POA: Diagnosis not present

## 2020-09-02 DIAGNOSIS — R070 Pain in throat: Secondary | ICD-10-CM | POA: Diagnosis present

## 2020-09-02 LAB — SARS CORONAVIRUS 2 BY RT PCR (HOSPITAL ORDER, PERFORMED IN ~~LOC~~ HOSPITAL LAB): SARS Coronavirus 2: NEGATIVE

## 2020-09-02 LAB — GROUP A STREP BY PCR: Group A Strep by PCR: NOT DETECTED

## 2020-09-02 MED ORDER — AMOXICILLIN 500 MG PO CAPS: 500.0000 mg | ORAL_CAPSULE | Freq: Three times a day (TID) | ORAL | 0 refills | Status: DC

## 2020-09-02 NOTE — ED Notes (Signed)
See triage note  Presents with sore throat for about 3-4 days   No fever or other sx's  Having increased pain with swallowing

## 2020-09-02 NOTE — ED Triage Notes (Signed)
Patient presents to the ED with sore throat x 2 days.  Patient states that both of his sons have been diagnosed with strep throat.

## 2020-09-02 NOTE — ED Provider Notes (Signed)
[facility] Emergency Department Provider Note  ____________________________________________  Time seen: Approximately 9:01 AM  I have reviewed the triage vital signs and the nursing notes.   HISTORY  Chief Complaint Sore Throat    HPI Joel Waller is a 34 y.o. male that presents to the emergency department for evaluation of sore throat for 2 days.  Patient's children tested positive for strep throat over the weekend.  He requires a test to return to work.  No fever, nasal congestion, cough.   Past Medical History:  Diagnosis Date  . Asthma   . Hypertension     There are no problems to display for this patient.   History reviewed. No pertinent surgical history.  Prior to Admission medications   Medication Sig Start Date End Date Taking? Authorizing Provider  albuterol (PROVENTIL HFA;VENTOLIN HFA) 108 (90 Base) MCG/ACT inhaler Inhale 2 puffs into the lungs every 6 (six) hours as needed for wheezing or shortness of breath. 12/05/18   Menshew, Charlesetta Ivory, PA-C  amoxicillin (AMOXIL) 500 MG capsule Take 1 capsule (500 mg total) by mouth 3 (three) times daily. 09/02/20   Enid Derry, PA-C    Allergies Patient has no known allergies.  No family history on file.  Social History Social History   Tobacco Use  . Smoking status: Current Every Day Smoker  . Smokeless tobacco: Never Used  Substance Use Topics  . Alcohol use: No  . Drug use: No     Review of Systems  Constitutional: No fever/chills Eyes: No visual changes. No discharge. ENT: Negative for congestion and rhinorrhea.  Positive for sore throat. Cardiovascular: No chest pain. Respiratory: Negative for cough. No SOB. Gastrointestinal: No abdominal pain.  No nausea, no vomiting.  No diarrhea.  No constipation. Musculoskeletal: Negative for musculoskeletal pain. Skin: Negative for rash, abrasions, lacerations, ecchymosis. Neurological: Negative for  headaches.   ____________________________________________   PHYSICAL EXAM:  VITAL SIGNS: ED Triage Vitals  Enc Vitals Group     BP 09/02/20 0820 (!) 159/77     Pulse Rate 09/02/20 0820 80     Resp 09/02/20 0820 18     Temp 09/02/20 0820 98.6 F (37 C)     Temp Source 09/02/20 0820 Oral     SpO2 09/02/20 0820 98 %     Weight 09/02/20 0821 187 lb (84.8 kg)     Height 09/02/20 0821 5\' 10"  (1.778 m)     Head Circumference --      Peak Flow --      Pain Score 09/02/20 0821 3     Pain Loc --      Pain Edu? --      Excl. in GC? --      Constitutional: Alert and oriented. Well appearing and in no acute distress. Eyes: Conjunctivae are normal. PERRL. EOMI. No discharge. Head: Atraumatic. ENT: No frontal and maxillary sinus tenderness.      Ears: Tympanic membranes pearly gray with good landmarks. No discharge.      Nose: No congestion/rhinnorhea.      Mouth/Throat: Mucous membranes are moist. Oropharynx erythematous. Tonsils not enlarged. No exudates. Uvula midline. Neck: No stridor.   Hematological/Lymphatic/Immunilogical: No cervical lymphadenopathy. Cardiovascular: Normal rate, regular rhythm.  Good peripheral circulation. Respiratory: Normal respiratory effort without tachypnea or retractions. Lungs CTAB. Good air entry to the bases with no decreased or absent breath sounds. Gastrointestinal: Bowel sounds 4 quadrants. Soft and nontender to palpation. No guarding or rigidity. No palpable masses. No distention. Musculoskeletal:  Full range of motion to all extremities. No gross deformities appreciated. Neurologic:  Normal speech and language. No gross focal neurologic deficits are appreciated.  Skin:  Skin is warm, dry and intact. No rash noted. Psychiatric: Mood and affect are normal. Speech and behavior are normal. Patient exhibits appropriate insight and judgement.   ____________________________________________   LABS (all labs ordered are listed, but only abnormal  results are displayed)  Labs Reviewed  GROUP A STREP BY PCR  SARS CORONAVIRUS 2 BY RT PCR (HOSPITAL ORDER, PERFORMED IN [facility])   ____________________________________________  EKG   ____________________________________________  RADIOLOGY   No results found.  ____________________________________________    PROCEDURES  Procedure(s) performed:    Procedures    Medications - No data to display   ____________________________________________   INITIAL IMPRESSION / ASSESSMENT AND PLAN / ED COURSE  Pertinent labs & imaging results that were available during my care of the patient were reviewed by me and considered in my medical decision making (see chart for details).  Review of the [place] CSRS was performed in accordance of the [facility] prior to dispensing any controlled drugs.   Patient's diagnosis is consistent with pharyngitis. Vital signs and exam are reassuring.  Patient will be treated for strep throat based on his exposure.  Strep and Covid tests are pending, as he needs them to return to work.  Patient will be discharged home with prescriptions for amoxicillin. Patient is to follow up with primary care as needed or otherwise directed. Patient is given ED precautions to return to the ED for any worsening or new symptoms.   Joel Waller was evaluated in Emergency Department on 09/02/2020 for the symptoms described in the history of present illness. He was evaluated in the context of the global COVID-19 pandemic, which necessitated consideration that the patient might be at risk for infection with the SARS-CoV-2 virus that causes COVID-19. Institutional protocols and algorithms that pertain to the evaluation of patients at risk for COVID-19 are in a state of rapid change based on information released by regulatory bodies including the CDC and federal and state organizations. These policies and algorithms were followed during the patient's care in the  ED.  ____________________________________________  FINAL CLINICAL IMPRESSION(S) / ED DIAGNOSES  Final diagnoses:  Pharyngitis, unspecified etiology      NEW MEDICATIONS STARTED DURING THIS VISIT:  ED Discharge Orders         Ordered    amoxicillin (AMOXIL) 500 MG capsule  3 times daily        09/02/20 0949              This chart was dictated using voice recognition software/Dragon. Despite best efforts to proofread, errors can occur which can change the meaning. Any change was purely unintentional.    Enid Derry, PA-C 09/02/20 1413    Vicente Males Clent Jacks, MD 09/02/20 1455

## 2021-06-01 ENCOUNTER — Other Ambulatory Visit: Payer: Self-pay

## 2021-06-01 ENCOUNTER — Emergency Department: Payer: Managed Care, Other (non HMO)

## 2021-06-01 ENCOUNTER — Encounter: Payer: Self-pay | Admitting: Emergency Medicine

## 2021-06-01 ENCOUNTER — Emergency Department
Admission: EM | Admit: 2021-06-01 | Discharge: 2021-06-01 | Disposition: A | Discharge status: Home / Self Care | Payer: Managed Care, Other (non HMO) | Attending: Emergency Medicine | Admitting: Emergency Medicine

## 2021-06-01 DIAGNOSIS — U071 COVID-19: Secondary | ICD-10-CM | POA: Insufficient documentation

## 2021-06-01 DIAGNOSIS — Z28310 Unvaccinated for covid-19: Secondary | ICD-10-CM | POA: Insufficient documentation

## 2021-06-01 DIAGNOSIS — B349 Viral infection, unspecified: Secondary | ICD-10-CM | POA: Diagnosis not present

## 2021-06-01 DIAGNOSIS — I10 Essential (primary) hypertension: Secondary | ICD-10-CM | POA: Diagnosis not present

## 2021-06-01 DIAGNOSIS — F17200 Nicotine dependence, unspecified, uncomplicated: Secondary | ICD-10-CM | POA: Insufficient documentation

## 2021-06-01 DIAGNOSIS — R509 Fever, unspecified: Secondary | ICD-10-CM | POA: Diagnosis present

## 2021-06-01 DIAGNOSIS — J45909 Unspecified asthma, uncomplicated: Secondary | ICD-10-CM | POA: Insufficient documentation

## 2021-06-01 LAB — SARS CORONAVIRUS 2 (TAT 6-24 HRS): SARS Coronavirus 2: POSITIVE — AB

## 2021-06-01 MED ORDER — ACETAMINOPHEN 325 MG PO TABS
650.0000 mg | ORAL_TABLET | Freq: Once | ORAL | Status: AC | PRN
  Administered 2021-06-01: 650 mg via ORAL
  Filled 2021-06-01: qty 2

## 2021-06-01 MED ORDER — PSEUDOEPH-BROMPHEN-DM 30-2-10 MG/5ML PO SYRP: 5.0000 mL | ORAL_SOLUTION | Freq: Four times a day (QID) | ORAL | 0 refills | Status: DC | PRN

## 2021-06-01 MED ORDER — IBUPROFEN 800 MG PO TABS: 800.0000 mg | ORAL_TABLET | Freq: Three times a day (TID) | ORAL | 0 refills | Status: DC | PRN

## 2021-06-01 NOTE — Discharge Instructions (Signed)
Your chest x-ray was negative for bronchitis or pneumonia.  Advised self quarantine pending results of COVID-19 test.  If test is positive must quarantine for 10 days.  Your results will be found later today in the MyChart app.

## 2021-06-01 NOTE — ED Notes (Signed)
See triage note  Presents with fever and body aches  sxs' started couple of days ago

## 2021-06-01 NOTE — ED Triage Notes (Signed)
Pt to ED via POV with c/o HA x 3 days, generalized body aches and chills. Pt with noted fever on arrival.

## 2021-06-01 NOTE — ED Provider Notes (Signed)
[facility] Emergency Department Provider Note   ____________________________________________   Event Date/Time   First MD Initiated Contact with Patient 06/01/21 [phone]     (approximate)  I have reviewed the triage vital signs and the nursing notes.   HISTORY  Chief Complaint Headache and Generalized Body Aches    HPI Joel Waller is a 35 y.o. male patient presents with 3 days of fever, body aches, sore throat.  Denies recent travel or known contact with COVID-19.  Patient is not taking the COVID-vaccine or flu shot.  When asked about his elevated blood pressure patient does not wish to address blood pressure at this time.         Past Medical History:  Diagnosis Date  . Asthma   . Hypertension     There are no problems to display for this patient.   History reviewed. No pertinent surgical history.  Prior to Admission medications   Medication Sig Start Date End Date Taking? Authorizing Provider  brompheniramine-pseudoephedrine-DM 30-2-10 MG/5ML syrup Take 5 mLs by mouth 4 (four) times daily as needed. 06/01/21  Yes Joni Reining, PA-C  ibuprofen (ADVIL) 800 MG tablet Take 1 tablet (800 mg total) by mouth every 8 (eight) hours as needed for moderate pain. 06/01/21  Yes Joni Reining, PA-C  albuterol (PROVENTIL HFA;VENTOLIN HFA) 108 (90 Base) MCG/ACT inhaler Inhale 2 puffs into the lungs every 6 (six) hours as needed for wheezing or shortness of breath. 12/05/18   Menshew, Charlesetta Ivory, PA-C    Allergies Patient has no known allergies.  History reviewed. No pertinent family history.  Social History Social History   Tobacco Use  . Smoking status: Current Every Day Smoker  . Smokeless tobacco: Never Used  Substance Use Topics  . Alcohol use: No  . Drug use: No    Review of Systems Constitutional: Fever and body aches.   Eyes: No visual changes. ENT: Sore throat. Cardiovascular: Denies chest pain. Respiratory: Denies shortness  of breath. Gastrointestinal: No abdominal pain.  No nausea, no vomiting.  No diarrhea.  No constipation. Genitourinary: Negative for dysuria. Musculoskeletal: Negative for back pain. Skin: Negative for rash. Neurological: Negative for headaches, focal weakness or numbness.   ____________________________________________   PHYSICAL EXAM:  VITAL SIGNS: ED Triage Vitals [06/01/21 0718]  Enc Vitals Group     BP (!) 166/99     Pulse Rate 98     Resp 20     Temp (!) 101.5 F (38.6 C)     Temp Source Oral     SpO2 99 %     Weight 185 lb (83.9 kg)     Height 5\' 10"  (1.778 m)     Head Circumference      Peak Flow      Pain Score 9     Pain Loc      Pain Edu?      Excl. in GC?    Constitutional: Alert and oriented. Well appearing and in no acute distress.  Febrile. Neck: No stridor. Hematological/Lymphatic/Immunilogical: No cervical lymphadenopathy. Cardiovascular: Normal rate, regular rhythm. Grossly normal heart sounds.  Good peripheral circulation.  Elevated blood pressure. Respiratory: Normal respiratory effort.  No retractions. Lungs CTAB. Gastrointestinal: Soft and nontender. No distention. No abdominal bruits. No CVA tenderness. Neurologic:  Normal speech and language. No gross focal neurologic deficits are appreciated. No gait instability. Skin:  Skin is warm, dry and intact. No rash noted. Psychiatric: Mood and affect are normal. Speech and behavior  are normal.  ____________________________________________   LABS (all labs ordered are listed, but only abnormal results are displayed)  Labs Reviewed  SARS CORONAVIRUS 2 (TAT 6-24 HRS)   ____________________________________________  EKG   ____________________________________________  RADIOLOGY I, Joni Reining, personally viewed and evaluated these images (plain radiographs) as part of my medical decision making, as well as reviewing the written report by the radiologist.  ED MD interpretation: No acute findings  on chest x-ray.  Official radiology report(s): DG Chest Portable 1 View  Result Date: 06/01/2021 CLINICAL DATA:  Per EXAM: PORTABLE CHEST 1 VIEW COMPARISON:  December 05, 2018 FINDINGS: Lungs are clear. Heart size and pulmonary vascularity are normal. No adenopathy. Thoracolumbar levoscoliosis again noted. IMPRESSION: Lungs clear.  Cardiac silhouette normal. Electronically Signed   By: Bretta Bang III M.D.   On: 06/01/2021 08:09    ____________________________________________   PROCEDURES  Procedure(s) performed (including Critical Care):  Procedures   ____________________________________________   INITIAL IMPRESSION / ASSESSMENT AND PLAN / ED COURSE  As part of my medical decision making, I reviewed the following data within the electronic MEDICAL RECORD NUMBER         Patient presents with 3 days of headache, body aches, and fever.  Discussed no acute findings on chest x-ray.  Patient complaining physical exam consistent with viral illness.  Patient advised COVID-19 test results are pending.  Patient given discharge care instruction advised take medication as directed.  He may find results of COVID-19 test in the MyChart app later today.      ____________________________________________   FINAL CLINICAL IMPRESSION(S) / ED DIAGNOSES  Final diagnoses:  Viral illness     ED Discharge Orders         Ordered    ibuprofen (ADVIL) 800 MG tablet  Every 8 hours PRN        06/01/21 0821    brompheniramine-pseudoephedrine-DM 30-2-10 MG/5ML syrup  4 times daily PRN        06/01/21 1157           Note:  This document was prepared using Dragon voice recognition software and may include unintentional dictation errors.    Joni Reining, PA-C 06/01/21 2620    Chesley Noon, MD 06/02/21 1048

## 2021-10-23 IMAGING — DX DG CHEST 1V PORT
1 series · 1 of 1 positions shown · non-contrast
Comparison: December 05, 2018

CLINICAL DATA: Per

EXAM:
PORTABLE CHEST 1 VIEW

[chest ap]
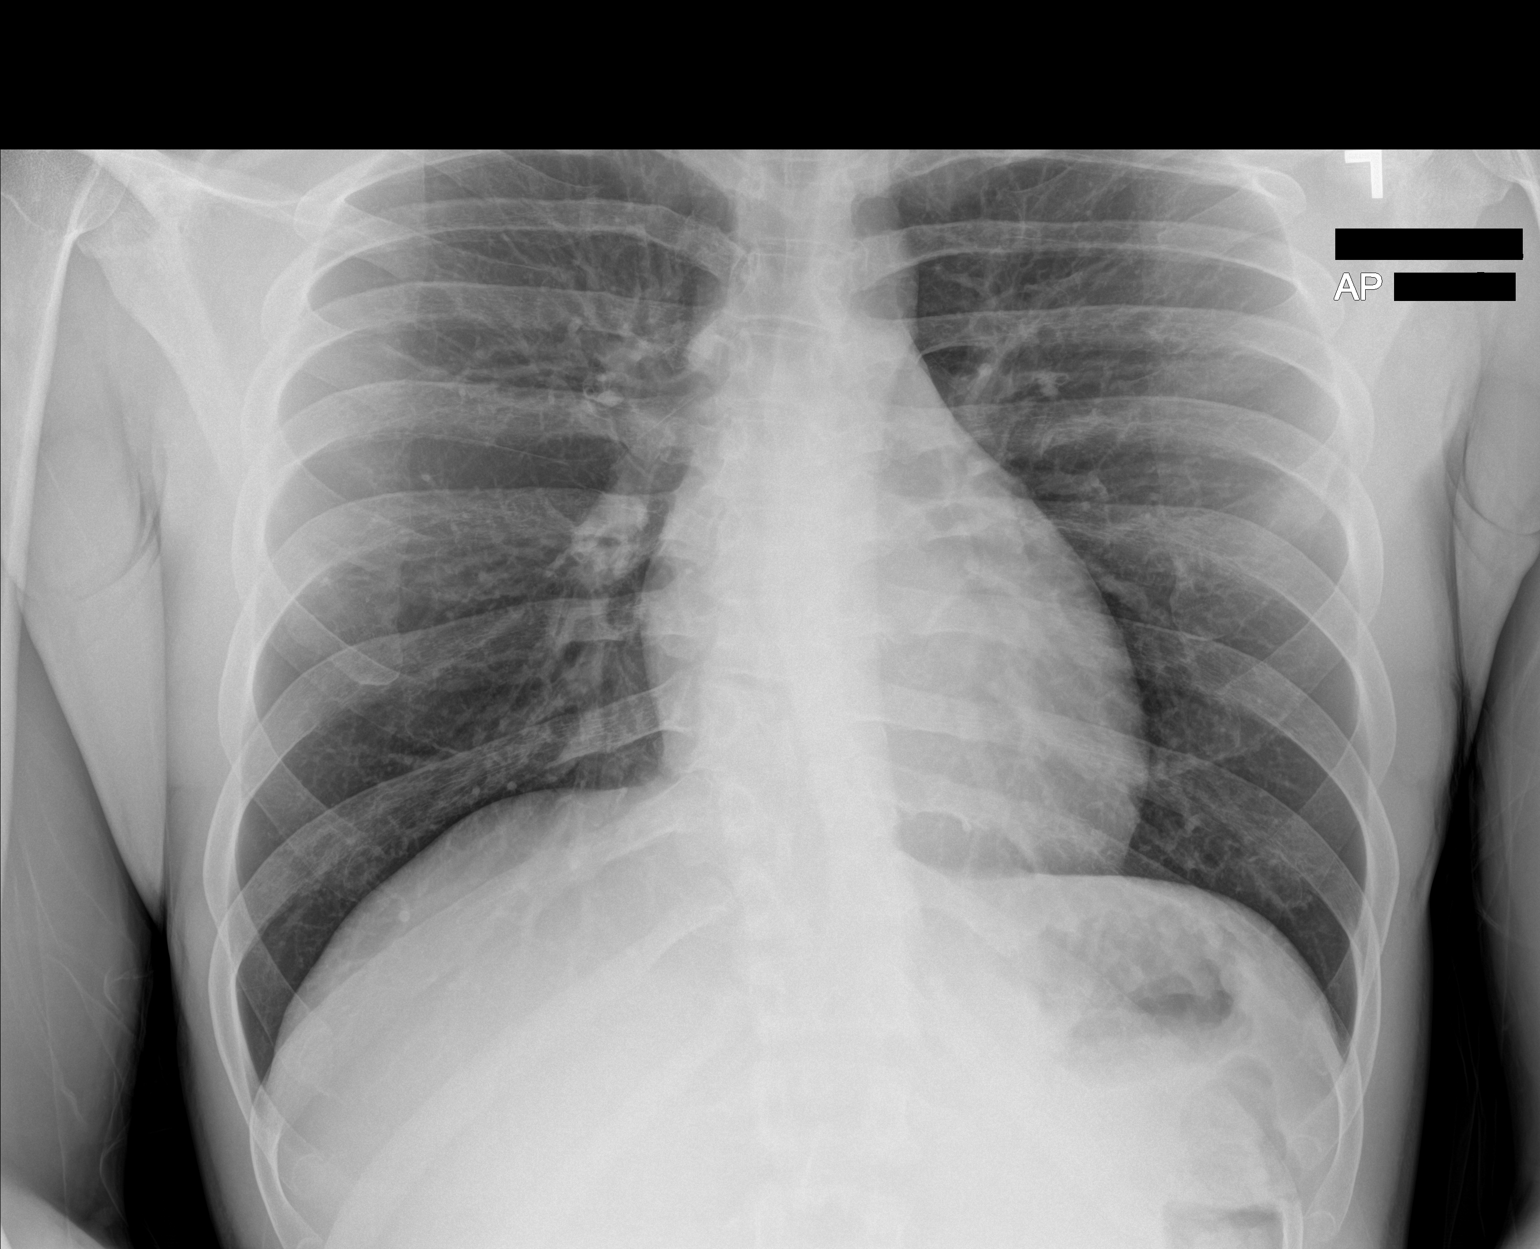

[1 of 1 positions shown; findings below may reference images not displayed]

FINDINGS: Lungs are clear. Heart size and pulmonary vascularity are normal. No
adenopathy. Thoracolumbar levoscoliosis again noted.
IMPRESSION: Lungs clear.  Cardiac silhouette normal.

## 2022-10-21 ENCOUNTER — Encounter: Payer: Self-pay | Admitting: Emergency Medicine

## 2022-10-21 ENCOUNTER — Emergency Department: Payer: Managed Care, Other (non HMO)

## 2022-10-21 ENCOUNTER — Other Ambulatory Visit: Payer: Self-pay

## 2022-10-21 ENCOUNTER — Emergency Department
Admission: EM | Admit: 2022-10-21 | Discharge: 2022-10-21 | Disposition: A | Discharge status: Home / Self Care | Payer: Managed Care, Other (non HMO) | Attending: Emergency Medicine | Admitting: Emergency Medicine

## 2022-10-21 DIAGNOSIS — J209 Acute bronchitis, unspecified: Secondary | ICD-10-CM | POA: Insufficient documentation

## 2022-10-21 DIAGNOSIS — J45909 Unspecified asthma, uncomplicated: Secondary | ICD-10-CM | POA: Diagnosis not present

## 2022-10-21 DIAGNOSIS — Z20822 Contact with and (suspected) exposure to covid-19: Secondary | ICD-10-CM | POA: Diagnosis not present

## 2022-10-21 DIAGNOSIS — R059 Cough, unspecified: Secondary | ICD-10-CM | POA: Diagnosis present

## 2022-10-21 LAB — RESP PANEL BY RT-PCR (FLU A&B, COVID) ARPGX2
Influenza A by PCR: NEGATIVE
Influenza B by PCR: NEGATIVE
SARS Coronavirus 2 by RT PCR: NEGATIVE

## 2022-10-21 MED ORDER — ALBUTEROL SULFATE HFA 108 (90 BASE) MCG/ACT IN AERS: 2.0000 | INHALATION_SPRAY | Freq: Four times a day (QID) | RESPIRATORY_TRACT | 2 refills | Status: DC | PRN

## 2022-10-21 MED ORDER — AZITHROMYCIN 250 MG PO TABS: ORAL_TABLET | ORAL | 0 refills | Status: DC

## 2022-10-21 MED ORDER — PREDNISONE 10 MG (21) PO TBPK: ORAL_TABLET | ORAL | 0 refills | Status: DC

## 2022-10-21 MED ORDER — IPRATROPIUM-ALBUTEROL 0.5-2.5 (3) MG/3ML IN SOLN
3.0000 mL | Freq: Once | RESPIRATORY_TRACT | Status: AC
  Administered 2022-10-21: 3 mL via RESPIRATORY_TRACT
  Filled 2022-10-21: qty 3

## 2022-10-21 NOTE — ED Provider Notes (Signed)
[facility] Provider Note    Event Date/Time   First MD Initiated Contact with Patient 10/21/22 [phone]     (approximate)   History   Cough   HPI  Joel Waller is a 36 y.o. male with history of asthma presents emergency department complaining of cough and flulike symptoms.  Patient states he is afraid he has either pneumonia or COVID.  Has had fever and chills.  Coworker had COVID.  History of hypertension but is not on medications.  No vomiting or diarrhea      Physical Exam   Triage Vital Signs: ED Triage Vitals [10/21/22 0843]  Enc Vitals Group     BP (!) 166/117     Pulse Rate 87     Resp 18     Temp 98.4 F (36.9 C)     Temp Source Oral     SpO2 99 %     Weight 184 lb 15.5 oz (83.9 kg)     Height 5\' 10"  (1.778 m)     Head Circumference      Peak Flow      Pain Score 6     Pain Loc      Pain Edu?      Excl. in GC?     Most recent vital signs: Vitals:   10/21/22 0843  BP: (!) 166/117  Pulse: 87  Resp: 18  Temp: 98.4 F (36.9 C)  SpO2: 99%     General: Awake, no distress.   CV:  Good peripheral perfusion. regular rate and  rhythm Resp:  Normal effort. Lungs with rhonchi on the left lower lung Abd:  No distention.   Other:      ED Results / Procedures / Treatments   Labs (all labs ordered are listed, but only abnormal results are displayed) Labs Reviewed  RESP PANEL BY RT-PCR (FLU A&B, COVID) [id number]     EKG     RADIOLOGY Chest x-ray    PROCEDURES:   Procedures   MEDICATIONS ORDERED IN ED: Medications  ipratropium-albuterol (DUONEB) 0.5-2.5 (3) MG/3ML nebulizer solution 3 mL (has no administration in time range)     IMPRESSION / MDM / ASSESSMENT AND PLAN / ED COURSE  I reviewed the triage vital signs and the nursing notes.                              Differential diagnosis includes, but is not limited to, COVID, influenza, CAP, acute bronchitis  Patient's presentation is most consistent with  acute complicated illness / injury requiring diagnostic workup.   Patient's respiratory panel for COVID and influenza is negative  Chest x-ray independently reviewed and interpreted by me as being negative, confirmed by radiology  DuoNeb  Patient had increased air movement after DuoNeb.  Be discharged with prescription for Z-Pak, Sterapred, and albuterol inhaler.  He is to follow-up with his regular doctor if not improving 3 days.  Return if worsening.  Patient is in agreement treatment plan.  Discharged stable condition.      FINAL CLINICAL IMPRESSION(S) / ED DIAGNOSES   Final diagnoses:  Acute bronchitis, unspecified organism     Rx / DC Orders   ED Discharge Orders          Ordered    azithromycin (ZITHROMAX Z-PAK) 250 MG tablet        10/21/22 0938    predniSONE (STERAPRED UNI-PAK 21 TAB) 10 MG (21) TBPK  tablet        10/21/22 0938    albuterol (VENTOLIN HFA) 108 (90 Base) MCG/ACT inhaler  Every 6 hours PRN        10/21/22 8768             Note:  This document was prepared using Dragon voice recognition software and may include unintentional dictation errors.    Versie Starks, PA-C 10/21/22 0940    Lavonia Drafts, MD 10/21/22 1001

## 2022-10-21 NOTE — ED Triage Notes (Signed)
Pt here with flu-like symptoms. Pt thinks that he may have the flu or covid. Pt having sneezing, coughing, headache, and a fever at home. Pt states he has a hx of htn but has a appt next month to start medication.

## 2022-10-21 NOTE — Discharge Instructions (Signed)
Follow-up with your regular doctor if not improved in 3 days.  Return emergency department worsening.  Take medications as prescribed.  Use inhaler to prevent wheezing

## 2022-10-21 NOTE — ED Notes (Signed)
See triage note  Presents with body aches,cough and h/a  States sx's started yesterday Subjective fever yesterday

## 2022-11-02 ENCOUNTER — Encounter: Payer: Self-pay | Admitting: Internal Medicine

## 2022-11-02 ENCOUNTER — Other Ambulatory Visit (HOSPITAL_COMMUNITY)
Admission: RE | Admit: 2022-11-02 | Discharge: 2022-11-02 | Disposition: A | Discharge status: Home / Self Care | Payer: Managed Care, Other (non HMO) | Source: Ambulatory Visit | Attending: Internal Medicine | Admitting: Internal Medicine

## 2022-11-02 ENCOUNTER — Ambulatory Visit: Payer: Managed Care, Other (non HMO) | Admitting: Internal Medicine

## 2022-11-02 VITALS — BP 162/108 | HR 85 | Temp 98.6°F | Resp 16 | Ht 70.0 in | Wt 192.6 lb

## 2022-11-02 DIAGNOSIS — L738 Other specified follicular disorders: Secondary | ICD-10-CM | POA: Diagnosis not present

## 2022-11-02 DIAGNOSIS — Z113 Encounter for screening for infections with a predominantly sexual mode of transmission: Secondary | ICD-10-CM

## 2022-11-02 DIAGNOSIS — I10 Essential (primary) hypertension: Secondary | ICD-10-CM | POA: Diagnosis not present

## 2022-11-02 DIAGNOSIS — R590 Localized enlarged lymph nodes: Secondary | ICD-10-CM | POA: Diagnosis not present

## 2022-11-02 DIAGNOSIS — Z114 Encounter for screening for human immunodeficiency virus [HIV]: Secondary | ICD-10-CM

## 2022-11-02 DIAGNOSIS — Z13220 Encounter for screening for lipoid disorders: Secondary | ICD-10-CM

## 2022-11-02 DIAGNOSIS — Z1159 Encounter for screening for other viral diseases: Secondary | ICD-10-CM

## 2022-11-02 MED ORDER — LISINOPRIL 10 MG PO TABS: 10.0000 mg | ORAL_TABLET | Freq: Every day | ORAL | 1 refills | Status: DC

## 2022-11-02 MED ORDER — CLINDAMYCIN PHOSPHATE 1 % EX GEL: Freq: Two times a day (BID) | CUTANEOUS | 0 refills | Status: DC

## 2022-11-02 NOTE — Patient Instructions (Addendum)
It was great seeing you today!  Plan discussed at today's visit: -Blood work ordered today, results will be uploaded to MyChart.  -Start Lisinopril 10 mg daily -Ultrasound of neck ordered. Please call to schedule at [phone] -Antibiotic gel for folliculitis sent as well    Follow up in: 1 month  Take care and let us know if you have any questions or concerns prior to your next visit.  Dr. Rosana Berger

## 2022-11-02 NOTE — Progress Notes (Signed)
New Patient Office Visit  Subjective    Patient ID: Joel Waller, male    DOB: 1986/04/08  Age: 36 y.o. MRN: [id number]  CC:  Chief Complaint  Patient presents with   Establish Care   Hypertension   std screening    HPI Joel Waller presents to establish care. He has no chronic medical conditions and does not take any daily medications. He has had issues with his blood pressure before but is not on anything now.  He had been on something in the past but does not remember what this was and just stopped taking it.  He denied any side effects.  Hypertension: -Medications: Nothing currently -Patient is compliant with above medications and reports no side effects. -Checking BP at home (average): Not checking -Denies any SOB, CP, vision changes, LE edema or symptoms of hypotension.  Can tell when blood pressure is high because he gets headaches  History of Asthma: -Diagnosed in childhood -Using Albuterol occasionally, had a URI a few weeks ago but normally does not have to use inhaler  LUMP Duration: weeks Location: Right side of neck Onset: sudden Painful: no Discomfort: no Status:  smaller Trauma: no Redness: no Bruising: no Recent infection: yes History of cancer: no Family history of cancer: no History of the same:  had a cyst removal on his face - thinks it was a sebaceous cyst Associated signs and symptoms: None  Also noticed a bad smell whenever he shaves his beard.  States he will sometimes get tiny pustule, pimple-like lesions on his face under his beard.   Outpatient Encounter Medications as of 11/02/2022  Medication Sig   albuterol (VENTOLIN HFA) 108 (90 Base) MCG/ACT inhaler Inhale 2 puffs into the lungs every 6 (six) hours as needed for wheezing or shortness of breath.   [DISCONTINUED] azithromycin (ZITHROMAX Z-PAK) 250 MG tablet 2 pills today then 1 pill a day for 4 days   [DISCONTINUED] predniSONE (STERAPRED UNI-PAK 21 TAB) 10 MG (21) TBPK tablet Take 6  pills on day one then decrease by 1 pill each day   No facility-administered encounter medications on file as of 11/02/2022.    Past Medical History:  Diagnosis Date   Asthma    Hypertension     Past Surgical History:  Procedure Laterality Date   CYST EXCISION     face    Family History  Problem Relation Age of Onset   Hypertension Mother    Arthritis Mother    HIV Brother    Bronchitis Son     Social History   Socioeconomic History   Marital status: Married    Spouse name: Not on file   Number of children: Not on file   Years of education: Not on file   Highest education level: Not on file  Occupational History   Not on file  Tobacco Use   Smoking status: Every Day    Packs/day: 1.00    Types: Cigarettes   Smokeless tobacco: Never  Vaping Use   Vaping Use: Never used  Substance and Sexual Activity   Alcohol use: No   Drug use: No   Sexual activity: Yes  Other Topics Concern   Not on file  Social History Narrative   Not on file   Social Determinants of Health   Financial Resource Strain: Not on file  Food Insecurity: Not on file  Transportation Needs: Not on file  Physical Activity: Not on file  Stress: Not on file  Social Connections: Not  on file  Intimate Partner Violence: Not on file    Review of Systems  Constitutional:  Negative for chills and fever.  HENT:  Negative for congestion.   Eyes:  Negative for blurred vision.  Respiratory:  Negative for cough, shortness of breath and wheezing.   Cardiovascular:  Negative for chest pain.  Neurological:  Positive for headaches.      Objective    BP (!) 162/108   Pulse 85   Temp 98.6 F (37 C)   Resp 16   Ht 5\' 10"  (1.778 m)   Wt 192 lb 9.6 oz (87.4 kg)   SpO2 99%   BMI 27.64 kg/m   Physical Exam Constitutional:      Appearance: Normal appearance.  HENT:     Head: Normocephalic and atraumatic.     Mouth/Throat:     Mouth: Mucous membranes are moist.     Pharynx: Oropharynx is  clear.  Eyes:     Conjunctiva/sclera: Conjunctivae normal.  Neck:     Comments: Firm, solid feeling lesion in the right anterior cervical chain, non-tender, freely mobile.  Cardiovascular:     Rate and Rhythm: Normal rate and regular rhythm.  Pulmonary:     Effort: Pulmonary effort is normal.     Breath sounds: Normal breath sounds.  Musculoskeletal:     Right lower leg: No edema.     Left lower leg: No edema.  Lymphadenopathy:     Cervical: Cervical adenopathy present.  Skin:    General: Skin is warm and dry.  Neurological:     General: No focal deficit present.     Mental Status: He is alert. Mental status is at baseline.  Psychiatric:        Mood and Affect: Mood normal.        Behavior: Behavior normal.         Assessment & Plan:   1. Hypertension, unspecified type: Uncontrolled.  Had been on medication in the past but was not consistently taking it.  We will start lisinopril 10 mg today and obtain CBC and CMP.  Follow-up in 1 month to recheck where we will most likely start HCTZ as well for better control.  - CBC w/Diff/Platelet - COMPLETE METABOLIC PANEL WITH GFR - lisinopril (ZESTRIL) 10 MG tablet; Take 1 tablet (10 mg total) by mouth daily.  Dispense: 30 tablet; Refill: 1  2. Lymphadenopathy, cervical: Lesion could be enlarged lymph node as he recently had a upper respiratory illness, however could also be a small sebaceous cyst/lipoma.  We will obtain CBC today and obtain an ultrasound of the area.  - US Soft Tissue Head/Neck (NON-THYROID); Future  3. Folliculitis barbae: Clindamycin gel sent to pharmacy to try.  - clindamycin (CLINDAGEL) 1 % gel; Apply topically 2 (two) times daily.  Dispense: 30 g; Refill: 0  4. Screen for STD (sexually transmitted disease): STD testing at patient request today.  - HIV antibody (with reflex) - Hepatitis C Antibody - Urine cytology ancillary only - RPR  5. Lipid screening/Encounter for hepatitis C screening test for low  risk patient/Screening for HIV without presence of risk factors: Other screening labs obtained today.  - Lipid Profile - Hepatitis C Antibody - HIV antibody (with reflex)   Return in about 4 weeks (around 11/30/2022).   Teodora Medici, DO

## 2022-11-03 ENCOUNTER — Telehealth: Payer: Self-pay | Admitting: Internal Medicine

## 2022-11-03 LAB — COMPLETE METABOLIC PANEL WITH GFR
AG Ratio: 1.5 (calc) (ref 1.0–2.5)
ALT: 15 U/L (ref 9–46)
AST: 15 U/L (ref 10–40)
Albumin: 4.6 g/dL (ref 3.6–5.1)
Alkaline phosphatase (APISO): 45 U/L (ref 36–130)
BUN/Creatinine Ratio: 15 (calc) (ref 6–22)
BUN: 20 mg/dL (ref 7–25)
CO2: 29 mmol/L (ref 20–32)
Calcium: 9.7 mg/dL (ref 8.6–10.3)
Chloride: 106 mmol/L (ref 98–110)
Creat: 1.37 mg/dL — ABNORMAL HIGH (ref 0.60–1.26)
Globulin: 3.1 g/dL (calc) (ref 1.9–3.7)
Glucose, Bld: 90 mg/dL (ref 65–99)
Potassium: 4.1 mmol/L (ref 3.5–5.3)
Sodium: 140 mmol/L (ref 135–146)
Total Bilirubin: 0.6 mg/dL (ref 0.2–1.2)
Total Protein: 7.7 g/dL (ref 6.1–8.1)
eGFR: 69 mL/min/{1.73_m2} (ref >=60)

## 2022-11-03 LAB — CBC WITH DIFFERENTIAL/PLATELET
Absolute Monocytes: 372 cells/uL (ref 200–950)
Basophils Absolute: 40 cells/uL (ref 0–200)
Basophils Relative: 1 %
Eosinophils Absolute: 112 cells/uL (ref 15–500)
Eosinophils Relative: 2.8 %
HCT: 41.4 % (ref 38.5–50.0)
Hemoglobin: 13.7 g/dL (ref 13.2–17.1)
Lymphs Abs: 2100 cells/uL (ref 850–3900)
MCH: 28.2 pg (ref 27.0–33.0)
MCHC: 33.1 g/dL (ref 32.0–36.0)
MCV: 85.4 fL (ref 80.0–100.0)
MPV: 11 fL (ref 7.5–12.5)
Monocytes Relative: 9.3 %
Neutro Abs: 1376 cells/uL — ABNORMAL LOW (ref 1500–7800)
Neutrophils Relative %: 34.4 %
Platelets: 209 10*3/uL (ref 140–400)
RBC: 4.85 10*6/uL (ref 4.20–5.80)
RDW: 12.7 % (ref 11.0–15.0)
Total Lymphocyte: 52.5 %
WBC: 4 10*3/uL (ref 3.8–10.8)

## 2022-11-03 LAB — LIPID PANEL
Cholesterol: 111 mg/dL (ref <200)
HDL: 51 mg/dL (ref >=40)
LDL Cholesterol (Calc): 46 mg/dL (calc)
Non-HDL Cholesterol (Calc): 60 mg/dL (calc) (ref <130)
Total CHOL/HDL Ratio: 2.2 (calc) (ref <5.0)
Triglycerides: 60 mg/dL (ref <150)

## 2022-11-03 LAB — URINE CYTOLOGY ANCILLARY ONLY
Chlamydia: NEGATIVE
Comment: NEGATIVE
Comment: NEGATIVE
Comment: NORMAL
Neisseria Gonorrhea: NEGATIVE
Trichomonas: NEGATIVE

## 2022-11-03 LAB — RPR: RPR Ser Ql: NONREACTIVE

## 2022-11-03 LAB — HIV ANTIBODY (ROUTINE TESTING W REFLEX): HIV 1&2 Ab, 4th Generation: NONREACTIVE

## 2022-11-03 LAB — HEPATITIS C ANTIBODY: Hepatitis C Ab: NONREACTIVE

## 2022-11-03 NOTE — Telephone Encounter (Signed)
Patient called in to have lab results explained. Please call back when available

## 2022-11-08 ENCOUNTER — Ambulatory Visit: Payer: Managed Care, Other (non HMO) | Attending: Internal Medicine

## 2022-11-29 NOTE — Progress Notes (Deleted)
Established Patient Office Visit  Subjective    Patient ID: Joel Waller, male    DOB: 1986/06/07  Age: 36 y.o. MRN: [id number]  CC:  No chief complaint on file.   HPI Dupree Hyers presents to establish care. He has no chronic medical conditions and does not take any daily medications. He has had issues with his blood pressure before but is not on anything now.  He had been on something in the past but does not remember what this was and just stopped taking it.  He denied any side effects.  Hypertension: -Medications: Started on lisinopril 10 mg at last office visit -Patient is compliant with above medications and reports no side effects. -Checking BP at home (average): Not checking -Denies any SOB, CP, vision changes, LE edema or symptoms of hypotension.  Can tell when blood pressure is high because he gets headaches  History of Asthma: -Diagnosed in childhood -Using Albuterol occasionally, had a URI a few weeks ago but normally does not have to use inhaler  LUMP Duration: weeks Location: Right side of neck Onset: sudden Painful: no Discomfort: no Status:  smaller Trauma: no Redness: no Bruising: no Recent infection: yes History of cancer: no Family history of cancer: no History of the same:  had a cyst removal on his face - thinks it was a sebaceous cyst Associated signs and symptoms: None  Also noticed a bad smell whenever he shaves his beard.  States he will sometimes get tiny pustule, pimple-like lesions on his face under his beard.   Outpatient Encounter Medications as of 11/30/2022  Medication Sig   albuterol (VENTOLIN HFA) 108 (90 Base) MCG/ACT inhaler Inhale 2 puffs into the lungs every 6 (six) hours as needed for wheezing or shortness of breath.   clindamycin (CLINDAGEL) 1 % gel Apply topically 2 (two) times daily.   lisinopril (ZESTRIL) 10 MG tablet Take 1 tablet (10 mg total) by mouth daily.   No facility-administered encounter medications on file as  of 11/30/2022.    Past Medical History:  Diagnosis Date   Asthma    Hypertension     Past Surgical History:  Procedure Laterality Date   CYST EXCISION     face    Family History  Problem Relation Age of Onset   Hypertension Mother    Arthritis Mother    HIV Brother    Bronchitis Son     Social History   Socioeconomic History   Marital status: Married    Spouse name: Not on file   Number of children: Not on file   Years of education: Not on file   Highest education level: Not on file  Occupational History   Not on file  Tobacco Use   Smoking status: Every Day    Packs/day: 1.00    Types: Cigarettes   Smokeless tobacco: Never  Vaping Use   Vaping Use: Never used  Substance and Sexual Activity   Alcohol use: No   Drug use: No   Sexual activity: Yes  Other Topics Concern   Not on file  Social History Narrative   Not on file   Social Determinants of Health   Financial Resource Strain: Not on file  Food Insecurity: Not on file  Transportation Needs: Not on file  Physical Activity: Not on file  Stress: Not on file  Social Connections: Not on file  Intimate Partner Violence: Not on file    Review of Systems  Constitutional:  Negative for chills and fever.  HENT:  Negative for congestion.   Eyes:  Negative for blurred vision.  Respiratory:  Negative for cough, shortness of breath and wheezing.   Cardiovascular:  Negative for chest pain.  Neurological:  Positive for headaches.      Objective    There were no vitals taken for this visit.  Physical Exam Constitutional:      Appearance: Normal appearance.  HENT:     Head: Normocephalic and atraumatic.     Mouth/Throat:     Mouth: Mucous membranes are moist.     Pharynx: Oropharynx is clear.  Eyes:     Conjunctiva/sclera: Conjunctivae normal.  Neck:     Comments: Firm, solid feeling lesion in the right anterior cervical chain, non-tender, freely mobile.  Cardiovascular:     Rate and Rhythm:  Normal rate and regular rhythm.  Pulmonary:     Effort: Pulmonary effort is normal.     Breath sounds: Normal breath sounds.  Musculoskeletal:     Right lower leg: No edema.     Left lower leg: No edema.  Lymphadenopathy:     Cervical: Cervical adenopathy present.  Skin:    General: Skin is warm and dry.  Neurological:     General: No focal deficit present.     Mental Status: He is alert. Mental status is at baseline.  Psychiatric:        Mood and Affect: Mood normal.        Behavior: Behavior normal.         Assessment & Plan:   1. Hypertension, unspecified type: Uncontrolled.  Had been on medication in the past but was not consistently taking it.  We will start lisinopril 10 mg today and obtain CBC and CMP.  Follow-up in 1 month to recheck where we will most likely start HCTZ as well for better control.  - CBC w/Diff/Platelet - COMPLETE METABOLIC PANEL WITH GFR - lisinopril (ZESTRIL) 10 MG tablet; Take 1 tablet (10 mg total) by mouth daily.  Dispense: 30 tablet; Refill: 1  2. Lymphadenopathy, cervical: Lesion could be enlarged lymph node as he recently had a upper respiratory illness, however could also be a small sebaceous cyst/lipoma.  We will obtain CBC today and obtain an ultrasound of the area.  - US Soft Tissue Head/Neck (NON-THYROID); Future  3. Folliculitis barbae: Clindamycin gel sent to pharmacy to try.  - clindamycin (CLINDAGEL) 1 % gel; Apply topically 2 (two) times daily.  Dispense: 30 g; Refill: 0  4. Screen for STD (sexually transmitted disease): STD testing at patient request today.  - HIV antibody (with reflex) - Hepatitis C Antibody - Urine cytology ancillary only - RPR  5. Lipid screening/Encounter for hepatitis C screening test for low risk patient/Screening for HIV without presence of risk factors: Other screening labs obtained today.  - Lipid Profile - Hepatitis C Antibody - HIV antibody (with reflex)   No follow-ups on file.   Margarita Mail, DO

## 2022-11-30 ENCOUNTER — Ambulatory Visit: Payer: Managed Care, Other (non HMO) | Admitting: Internal Medicine

## 2022-12-16 ENCOUNTER — Ambulatory Visit: Payer: Managed Care, Other (non HMO) | Admitting: Internal Medicine

## 2022-12-16 ENCOUNTER — Encounter: Payer: Self-pay | Admitting: Internal Medicine

## 2022-12-16 VITALS — BP 136/82 | HR 108 | Temp 98.4°F | Resp 18 | Ht 70.0 in | Wt 202.6 lb

## 2022-12-16 DIAGNOSIS — G44209 Tension-type headache, unspecified, not intractable: Secondary | ICD-10-CM

## 2022-12-16 DIAGNOSIS — I10 Essential (primary) hypertension: Secondary | ICD-10-CM | POA: Diagnosis not present

## 2022-12-16 MED ORDER — LISINOPRIL 10 MG PO TABS: 10.0000 mg | ORAL_TABLET | Freq: Every day | ORAL | 1 refills | Status: DC

## 2022-12-16 NOTE — Patient Instructions (Addendum)
It was great seeing you today!  Plan discussed at today's visit: -Blood pressure medication refilled -Continue to stay well hydrated, plan to recheck labs at follow up   Follow up in: 6 months   Take care and let us know if you have any questions or concerns prior to your next visit.  Dr. Caralee Ates

## 2022-12-16 NOTE — Progress Notes (Signed)
Established Patient Office Visit  Subjective    Patient ID: Joel Waller, male    DOB: [birth year]  Age: 36 y.o. MRN: [id number]  CC:  Chief Complaint  Patient presents with   Follow-up   Hypertension    HPI Joel Waller presents to follow up on chronic medical conditions.   Hypertension: -Medications: Started on lisinopril 10 mg at last office visit -Patient is compliant with above medications and reports no side effects. -Checking BP at home (average): Not checking -Denies any SOB, CP, vision changes, LE edema or symptoms of hypotension.  Has been having headaches that he thought was associated with his blood pressure.  He has been taking his lisinopril every day without issues but still reports headaches quite frequently, multiple times a week.  He states the headaches are across his forehead and occur when he is stressed.  He does take anti-inflammatories which typically lessens the pain.  He does have symptoms currently.  History of Asthma: -Diagnosed in childhood -Using Albuterol occasionally, had a URI a few weeks ago but normally does not have to use inhaler   Outpatient Encounter Medications as of 12/16/2022  Medication Sig   albuterol (VENTOLIN HFA) 108 (90 Base) MCG/ACT inhaler Inhale 2 puffs into the lungs every 6 (six) hours as needed for wheezing or shortness of breath.   clindamycin (CLINDAGEL) 1 % gel Apply topically 2 (two) times daily.   lisinopril (ZESTRIL) 10 MG tablet Take 1 tablet (10 mg total) by mouth daily.   No facility-administered encounter medications on file as of 12/16/2022.    Past Medical History:  Diagnosis Date   Asthma    Hypertension     Past Surgical History:  Procedure Laterality Date   CYST EXCISION     face    Family History  Problem Relation Age of Onset   Hypertension Mother    Arthritis Mother    HIV Brother    Bronchitis Son     Social History   Socioeconomic History   Marital status: Married    Spouse name:  Not on file   Number of children: Not on file   Years of education: Not on file   Highest education level: Not on file  Occupational History   Not on file  Tobacco Use   Smoking status: Every Day    Packs/day: 1.00    Types: Cigarettes   Smokeless tobacco: Never  Vaping Use   Vaping Use: Never used  Substance and Sexual Activity   Alcohol use: No   Drug use: No   Sexual activity: Yes  Other Topics Concern   Not on file  Social History Narrative   Not on file   Social Determinants of Health   Financial Resource Strain: Not on file  Food Insecurity: Not on file  Transportation Needs: Not on file  Physical Activity: Not on file  Stress: Not on file  Social Connections: Not on file  Intimate Partner Violence: Not on file    Review of Systems  Constitutional:  Negative for chills and fever.  HENT:  Negative for congestion.   Eyes:  Negative for blurred vision.  Respiratory:  Negative for cough, shortness of breath and wheezing.   Cardiovascular:  Negative for chest pain.  Neurological:  Positive for headaches.      Objective    BP 136/82   Pulse (!) 108   Temp 98.4 F (36.9 C)   Resp 18   Ht 5\' 10"  (1.778 m)  Wt 202 lb 9.6 oz (91.9 kg)   SpO2 99%   BMI 29.07 kg/m   Physical Exam Constitutional:      Appearance: Normal appearance.  HENT:     Head: Normocephalic and atraumatic.  Eyes:     Conjunctiva/sclera: Conjunctivae normal.  Cardiovascular:     Rate and Rhythm: Normal rate and regular rhythm.  Pulmonary:     Effort: Pulmonary effort is normal.     Breath sounds: Normal breath sounds.  Musculoskeletal:     Right lower leg: No edema.     Left lower leg: No edema.  Skin:    General: Skin is warm and dry.  Neurological:     General: No focal deficit present.     Mental Status: He is alert. Mental status is at baseline.  Psychiatric:        Mood and Affect: Mood normal.        Behavior: Behavior normal.        Assessment & Plan:   1.  Hypertension, unspecified type: Blood pressure much better controlled today.  Continue lisinopril 10 mg daily, refilled.  Follow-up in 6 months to recheck.  - lisinopril (ZESTRIL) 10 MG tablet; Take 1 tablet (10 mg total) by mouth daily.  Dispense: 90 tablet; Refill: 1  2. Tension headache: Headaches more typical of tension headaches versus headaches due to high blood pressure.  Patient states he does not drink a lot of water, his creatinine was slightly elevated on his last set of labs.  Discussed how dehydration can cause chronic tension headaches as well as stress and muscle tension.  Recommend the patient increase his oral fluid intake and take ibuprofen 600 mg as needed for tension headaches.   Return in about 6 months (around 06/17/2023).   Teodora Medici, DO

## 2023-02-19 ENCOUNTER — Other Ambulatory Visit: Payer: Self-pay

## 2023-02-19 ENCOUNTER — Emergency Department
Admission: EM | Admit: 2023-02-19 | Discharge: 2023-02-19 | Disposition: A | Discharge status: Home / Self Care | Payer: Managed Care, Other (non HMO) | Attending: Emergency Medicine | Admitting: Emergency Medicine

## 2023-02-19 DIAGNOSIS — L731 Pseudofolliculitis barbae: Secondary | ICD-10-CM | POA: Insufficient documentation

## 2023-02-19 DIAGNOSIS — I10 Essential (primary) hypertension: Secondary | ICD-10-CM | POA: Insufficient documentation

## 2023-02-19 DIAGNOSIS — J45909 Unspecified asthma, uncomplicated: Secondary | ICD-10-CM | POA: Diagnosis not present

## 2023-02-19 DIAGNOSIS — N4821 Abscess of corpus cavernosum and penis: Secondary | ICD-10-CM | POA: Diagnosis not present

## 2023-02-19 DIAGNOSIS — L0291 Cutaneous abscess, unspecified: Secondary | ICD-10-CM

## 2023-02-19 MED ORDER — CEPHALEXIN 500 MG PO CAPS: 500.0000 mg | ORAL_CAPSULE | Freq: Three times a day (TID) | ORAL | 0 refills | Status: DC

## 2023-02-19 NOTE — ED Provider Notes (Signed)
[facility] Provider Note    Event Date/Time   First MD Initiated Contact with Patient 02/19/23 1217     (approximate)   History   Abscess   HPI  Joel Waller is a 37 y.o. male with history of asthma and hypertension presents emergency department with an abscess on his penile shaft.  Patient states he had an ingrown hair in this area once he remove the hair he started having a small amount of pus.  States area has gotten a little more swollen.  No fever or chills.  Had recent STI testing which was all negative.      Physical Exam   Triage Vital Signs: ED Triage Vitals  Enc Vitals Group     BP 02/19/23 1214 (!) 174/107     Pulse Rate 02/19/23 1214 78     Resp 02/19/23 1214 18     Temp 02/19/23 1214 97.6 F (36.4 C)     Temp src --      SpO2 02/19/23 1214 100 %     Weight 02/19/23 1212 200 lb (90.7 kg)     Height 02/19/23 1212 '5\' 10"'$  (1.778 m)     Head Circumference --      Peak Flow --      Pain Score 02/19/23 1212 0     Pain Loc --      Pain Edu? --      Excl. in [place]? --     Most recent vital signs: Vitals:   02/19/23 1214  BP: (!) 174/107  Pulse: 78  Resp: 18  Temp: 97.6 F (36.4 C)  SpO2: 100%     General: Awake, no distress.   CV:  Good peripheral perfusion. regular rate and  rhythm Resp:  Normal effort.  Abd:  No distention.   Other:  Genital exam: Penis with a small abscess with creamy colored discharge on the underside of the shaft.  Very typical of an infected ingrown hair.  No chancre noted.  No blisters noted, no penile discharge   ED Results / Procedures / Treatments   Labs (all labs ordered are listed, but only abnormal results are displayed) Labs Reviewed - No data to display   EKG     RADIOLOGY     PROCEDURES:   Procedures   MEDICATIONS ORDERED IN ED: Medications - No data to display   IMPRESSION / MDM / [place] / ED COURSE  I reviewed the triage vital signs and the nursing  notes.                              Differential diagnosis includes, but is not limited to, abscess, ingrown hair, syphilis  Patient's presentation is most consistent with acute, uncomplicated illness.   Area appears to be an infected ingrown hair.  Feel syphilis is less likely it does not appear to be a chancre and patient had negative STI testing 2 months ago.  No new exposures to any new partners.  He is to apply warm compress to the area.  Is given prescription for Keflex.  Follow-up with his regular doctor if not improving to 3 days.  Return for worsening.  He is in agreement treatment plan.  Was discharged stable condition.      FINAL CLINICAL IMPRESSION(S) / ED DIAGNOSES   Final diagnoses:  Ingrown hair  Abscess     Rx / DC Orders   ED  Discharge Orders          Ordered    cephALEXin (KEFLEX) 500 MG capsule  3 times daily        02/19/23 1238             Note:  This document was prepared using Dragon voice recognition software and may include unintentional dictation errors.    Versie Starks, PA-C 02/19/23 1242    Carrie Mew, MD 02/20/23 [phone]

## 2023-02-19 NOTE — ED Triage Notes (Signed)
Pt states coming in with a pubic abscess, that he noted a few days ago. Pt denies discharge from it. Pt states having been to PCP and tested negative for STI

## 2023-02-19 NOTE — Discharge Instructions (Signed)
Apply warm compress to the affected area.  Use a loofah once the area is healed to keep the hair from curling under.  A lotion such as Cetaphil after using a loofah to keep the skin moist Take the antibiotic as prescribed Return emergency department worsening

## 2023-04-08 ENCOUNTER — Other Ambulatory Visit: Payer: Self-pay | Admitting: Internal Medicine

## 2023-04-08 DIAGNOSIS — L738 Other specified follicular disorders: Secondary | ICD-10-CM

## 2023-04-08 MED ORDER — CLINDAMYCIN PHOSPHATE 1 % EX GEL: Freq: Two times a day (BID) | CUTANEOUS | 0 refills | Status: DC

## 2023-06-15 ENCOUNTER — Ambulatory Visit: Payer: Self-pay

## 2023-06-15 NOTE — Telephone Encounter (Signed)
      Chief Complaint: Pt. Reports his partner told him they tested positive for gonorrhea. He currently only has frequent urination which "I always have." Requests appointment. Symptoms: Exposure Frequency: 3 days ago Pertinent Negatives: Patient denies symptoms Disposition: [] ED /[] Urgent Care (no appt availability in office) / [x] Appointment(In office/virtual)/ []  [place] [facility]/ [] Home Care/ [] Refused Recommended Disposition /[] [place] [facility]/ []  Follow-up with PCP Additional Notes:   Answer Assessment - Initial Assessment Questions 1. MAIN SYMPTOM: "What is the main symptom or problem?"  (e.g., penis discharge, vaginal discharge)     No symptoms 2. ONSET: "When did   start?"     Exposure to gonorrhea  3. OTHER SYMPTOMS: "Do you have any other symptoms?" (e.g., fever, rash)     No  Answer Assessment - Initial Assessment Questions 1. SYMPTOMS: "Do you have any symptoms of a sexually transmitted infection (STI)?" If Yes, ask: "What are they?"     None 2. TYPE: "What sexually transmitted infection do you have questions about?"     Gonorrhea 3. EXPOSURE: "Were you exposed to an STI?" If Yes, ask: "When and what kind of exposure?"     Yes - partner told him they were positive for gonorrhea 4. PREVENTION: "Do you have questions about preventing AIDS and other sexually transmitted infections?"     No 5. PREGNANCY: "Is there any chance you are pregnant?" "When was your last menstrual period?"     N/a  Protocols used: STI Guideline Selection-A-AH, STI Questions-A-AH

## 2023-06-16 ENCOUNTER — Encounter: Payer: Self-pay | Admitting: Physician Assistant

## 2023-06-16 ENCOUNTER — Ambulatory Visit: Payer: Managed Care, Other (non HMO) | Admitting: Physician Assistant

## 2023-06-16 VITALS — BP 176/107 | HR 70 | Temp 98.2°F | Wt 192.2 lb

## 2023-06-16 DIAGNOSIS — Z202 Contact with and (suspected) exposure to infections with a predominantly sexual mode of transmission: Secondary | ICD-10-CM | POA: Diagnosis not present

## 2023-06-16 NOTE — Progress Notes (Signed)
Acute Office Visit   Patient: Joel Waller   DOB: [birth year]   37 y.o. Male  MRN: [id number] Visit Date: 06/16/2023  Today's healthcare provider: Oswaldo Conroy Delmo Matty, PA-C  Introduced myself to the patient as a Secondary school teacher and provided education on APPs in clinical practice.    Chief Complaint  Patient presents with   STI Screening    Pt states he was informed that his partner has tested positive for gonorrhea. States he thinks he was exposed on Monday. States he has noticed clear liquid coming from his penis yesterday.    Subjective    HPI HPI     STI Screening    Additional comments: Pt states he was informed that his partner has tested positive for gonorrhea. States he thinks he was exposed on Monday. States he has noticed clear liquid coming from his penis yesterday.       Last edited by Pablo Ledger, CMA on 06/16/2023  4:21 PM.        Concern for STI exposure  Patient is requesting testing for STIs after he was informed by a partner that they were positive for gonorrhea He reports his exposure was about 3 days ago (Monday) He reports he has had sex with another woman about 2-3 weeks ago  He reports current partner, who tested positive is currently getting treated.  He reports he has already disclosed to recent partners so they could go get tested    Medications: Outpatient Medications Prior to Visit  Medication Sig   lisinopril (ZESTRIL) 10 MG tablet Take 1 tablet (10 mg total) by mouth daily. (Patient not taking: Reported on 06/16/2023)   [DISCONTINUED] albuterol (VENTOLIN HFA) 108 (90 Base) MCG/ACT inhaler Inhale 2 puffs into the lungs every 6 (six) hours as needed for wheezing or shortness of breath.   [DISCONTINUED] cephALEXin (KEFLEX) 500 MG capsule Take 1 capsule (500 mg total) by mouth 3 (three) times daily.   [DISCONTINUED] clindamycin (CLINDAGEL) 1 % gel Apply topically 2 (two) times daily.   No facility-administered medications prior to visit.     Review of Systems  Constitutional:  Negative for chills, fatigue and fever.  Genitourinary:  Positive for frequency and penile discharge. Negative for dysuria, penile pain, penile swelling, scrotal swelling and testicular pain.       Objective    BP (!) 176/107   Pulse 70   Temp 98.2 F (36.8 C) (Oral)   Wt 192 lb 3.2 oz (87.2 kg)   SpO2 98%   BMI 27.58 kg/m    Physical Exam Vitals reviewed.  Constitutional:      General: He is awake.     Appearance: Normal appearance. He is well-developed and well-groomed.  HENT:     Head: Normocephalic and atraumatic.  Pulmonary:     Effort: Pulmonary effort is normal.  Neurological:     Mental Status: He is alert.  Psychiatric:        Attention and Perception: Attention and perception normal.        Mood and Affect: Mood and affect normal.        Speech: Speech normal.        Behavior: Behavior normal. Behavior is cooperative.       No results found for any visits on 06/16/23.  Assessment & Plan      No follow-ups on file.     Problem List Items Addressed This Visit   None Visit Diagnoses  STD exposure    -  Primary Acute, new concern Patient reports recent STD exposure and states he has started to have penile discharge  Will test for gonorrhea, chlamydia, HIV and syphilis- results to dictate further management Recommend practicing safe sex and alerting recent partners to exposure for testing  Will alert health dept for positive communicable diseases as indicated  Follow up as needed for persistent or progressing symptoms     Relevant Orders   GC/Chlamydia Probe Amp   HIV antibody (with reflex)   RPR w/reflex to TrepSure        No follow-ups on file.   I, Candita Borenstein E Zierra Laroque, PA-C, have reviewed all documentation for this visit. The documentation on 06/16/23 for the exam, diagnosis, procedures, and orders are all accurate and complete.   Jacquelin Hawking, MHS, PA-C [facility]

## 2023-06-17 ENCOUNTER — Ambulatory Visit (INDEPENDENT_AMBULATORY_CARE_PROVIDER_SITE_OTHER): Payer: Managed Care, Other (non HMO)

## 2023-06-17 ENCOUNTER — Ambulatory Visit: Payer: Self-pay

## 2023-06-17 DIAGNOSIS — R300 Dysuria: Secondary | ICD-10-CM

## 2023-06-17 DIAGNOSIS — Z202 Contact with and (suspected) exposure to infections with a predominantly sexual mode of transmission: Secondary | ICD-10-CM

## 2023-06-17 DIAGNOSIS — R369 Urethral discharge, unspecified: Secondary | ICD-10-CM

## 2023-06-17 MED ORDER — CEFTRIAXONE SODIUM 500 MG IJ SOLR
500.0000 mg | Freq: Once | INTRAMUSCULAR | Status: AC
  Administered 2023-06-17: 500 mg via INTRAMUSCULAR

## 2023-06-17 MED ORDER — LIDOCAINE HCL 1 % IJ SOLN: 10.0000 mL | Freq: Once | INTRAMUSCULAR | Status: DC

## 2023-06-17 MED ORDER — LIDOCAINE HCL (PF) 1 % IJ SOLN: 2.0000 mL | Freq: Once | INTRAMUSCULAR | Status: DC

## 2023-06-17 NOTE — Telephone Encounter (Signed)
  Chief Complaint: Treatment for Gonorrhea Symptoms: Burning with urination Frequency: today Pertinent Negatives: Patient denies  Disposition: [] ED /[] Urgent Care (no appt availability in office) / [] Appointment(In office/virtual)/ []  [place] [facility]/ [] Home Care/ [] Refused Recommended Disposition /[] [place] [facility]/ [x]  Follow-up with PCP Additional Notes: Pt was exposed to gonorrhea and was tested for same yesterday. Results are not yet back. Pt states that he is experiencing burning sensation with urination and would like to start medication for gonorrhea.  Please advise.    Reason for Disposition  Prescription request for new medicine (not a refill)  Answer Assessment - Initial Assessment Questions 1. DRUG NAME: "What medicine do you need to have refilled?"     Treatment for Gonorrhea 5. SYMPTOMS: "Do you have any symptoms?"     Yes - burning with urination.  Protocols used: Medication Refill and Renewal Call-A-AH

## 2023-06-18 LAB — TREPONEMAL ANTIBODIES, TPPA: Treponemal Antibodies, TPPA: NONREACTIVE

## 2023-06-18 LAB — HIV ANTIBODY (ROUTINE TESTING W REFLEX): HIV Screen 4th Generation wRfx: NONREACTIVE

## 2023-06-18 LAB — RPR W/REFLEX TO TREPSURE: RPR: NONREACTIVE

## 2023-06-19 LAB — GC/CHLAMYDIA PROBE AMP
Chlamydia trachomatis, NAA: NEGATIVE
Neisseria Gonorrhoeae by PCR: POSITIVE — AB

## 2023-06-20 ENCOUNTER — Ambulatory Visit: Payer: Managed Care, Other (non HMO) | Admitting: Family Medicine

## 2023-06-20 ENCOUNTER — Ambulatory Visit: Payer: Managed Care, Other (non HMO) | Admitting: Physician Assistant

## 2023-06-20 NOTE — Progress Notes (Signed)
Joel Waller- Please contact the health department for reporting of communicable disease  Your testing was negative for syphilis, HIV and Chlamydia Your testing was positive for Gonorrhea- I recommend that you schedule an apt with our office to get a shot of Ceftriaxone to treat this. Please refrain from sexual intercourse until at least one after you have been treated.

## 2023-06-20 NOTE — Progress Notes (Deleted)
Name: Joel Waller   MRN: [id number]    DOB: 09-May-1986   Date:06/20/2023       Progress Note  Subjective  Chief Complaint  Urinary Discomfort  HPI  *** Patient Active Problem List   Diagnosis Date Noted   Hypertension 11/02/2022    Past Surgical History:  Procedure Laterality Date   CYST EXCISION     face    Family History  Problem Relation Age of Onset   Hypertension Mother    Arthritis Mother    HIV Brother    Bronchitis Son     Social History   Tobacco Use   Smoking status: Every Day    Packs/day: 1    Types: Cigarettes   Smokeless tobacco: Never  Substance Use Topics   Alcohol use: No     Current Outpatient Medications:    lisinopril (ZESTRIL) 10 MG tablet, Take 1 tablet (10 mg total) by mouth daily. (Patient not taking: Reported on 06/16/2023), Disp: 90 tablet, Rfl: 1  No Known Allergies  I personally reviewed active problem list, medication list, allergies, family history, social history, health maintenance with the patient/caregiver today.   ROS  ***  Objective  There were no vitals filed for this visit.  There is no height or weight on file to calculate BMI.  Physical Exam ***  Recent Results (from the past 2160 hour(s))  GC/Chlamydia Probe Amp     Status: Abnormal   Collection Time: 06/16/23  4:28 PM   Specimen: Urine   UR  Result Value Ref Range   Chlamydia trachomatis, NAA Negative Negative   Neisseria Gonorrhoeae by PCR Positive (A) Negative  RPR w/reflex to TrepSure     Status: None   Collection Time: 06/16/23  4:31 PM  Result Value Ref Range   RPR Non Reactive Non Reactive  HIV Antibody (routine testing w rflx)     Status: None   Collection Time: 06/16/23  4:31 PM  Result Value Ref Range   HIV Screen 4th Generation wRfx Non Reactive Non Reactive    Comment: HIV-1/HIV-2 antibodies and HIV-1 p24 antigen were NOT detected. There is no laboratory evidence of HIV infection. HIV Negative   Treponemal Antibodies, TPPA      Status: None   Collection Time: 06/16/23  4:31 PM  Result Value Ref Range   Treponemal Antibodies, TPPA Non Reactive Non Reactive   Interpretation: Comment     Comment: Syphilis: Treponemal Antibodies with Reflex to RPR and RPR           Titer, Reverse Screening and Diagnosis Algorithm ------------------------------------------------------------ Treponemal              Treponemal     Ab       RPR, Qn     Ab, TPPA    Final Interpretation ----------   --------   ----------   ----------------------- Non          N/A        N/A          No laboratory evidence Reactive                             of syphilis. Retest in                                      2-4 weeks if recent  exposure is suspected. ----------   --------   ----------   ----------------------- Reactive     Non        Non          Treponemal antibodies              Reactive   Reactive     not confirmed.                                      Inconclusive for                                      syphilis; potential                                      early syphilis,                                      possible false                                       positive. Retest in 2-4                                      weeks if recent                                      exposure is suspected. ----------   --------   ----------   ----------------------- Reactive     Non        Reactive     Treponemal antibodies              Reactive                detected. Consistent                                      with past or current                                      (potential early)                                      syphilis. ----------   --------   ----------   ----------------------- Reactive     >/=1:1     N/A          Treponemal and                                      nontreponemal  antibodies detected.                                      Consistent with  current                                      or past syphilis. This test is intended ONLY for specimens that have tested positive (reactive) or equivocal for Treponema pallidum antibodies prior to submission for testing.  For the full CDC-recommended syphilis screening and diagnosis algorithm, [facility] offers test code 012005 RPR, Rfx Qn RPR/Confirm TP or 161096 T pallidum Screening Cascade.     PHQ2/9:    12/16/2022    3:49 PM 11/02/2022   10:24 AM  Depression screen PHQ 2/9  Decreased Interest 0 0  Down, Depressed, Hopeless 0 0  PHQ - 2 Score 0 0  Altered sleeping 0 0  Tired, decreased energy 0 0  Change in appetite 0 0  Feeling bad or failure about yourself  0 0  Trouble concentrating 0 0  Moving slowly or fidgety/restless 0 0  Suicidal thoughts 0 0  PHQ-9 Score 0 0  Difficult doing work/chores Not difficult at all Not difficult at all    phq 9 is {gen pos [id number]}   Fall Risk:    12/16/2022    3:49 PM 11/02/2022   10:24 AM  Fall Risk   Falls in the past year? 0 0  Number falls in past yr: 0 0  Injury with Fall? 0 0      Functional Status Survey:      Assessment & Plan  *** There are no diagnoses linked to this encounter.

## 2023-06-27 ENCOUNTER — Ambulatory Visit: Payer: Managed Care, Other (non HMO) | Admitting: Nurse Practitioner

## 2023-06-27 ENCOUNTER — Other Ambulatory Visit (HOSPITAL_COMMUNITY)
Admission: RE | Admit: 2023-06-27 | Discharge: 2023-06-27 | Disposition: A | Discharge status: Home / Self Care | Payer: Managed Care, Other (non HMO) | Source: Ambulatory Visit | Attending: Nurse Practitioner | Admitting: Nurse Practitioner

## 2023-06-27 ENCOUNTER — Other Ambulatory Visit: Payer: Self-pay

## 2023-06-27 ENCOUNTER — Encounter: Payer: Self-pay | Admitting: Nurse Practitioner

## 2023-06-27 VITALS — BP 162/94 | HR 80 | Temp 98.2°F | Resp 18 | Ht 70.0 in | Wt 188.5 lb

## 2023-06-27 DIAGNOSIS — I10 Essential (primary) hypertension: Secondary | ICD-10-CM | POA: Diagnosis not present

## 2023-06-27 DIAGNOSIS — Z202 Contact with and (suspected) exposure to infections with a predominantly sexual mode of transmission: Secondary | ICD-10-CM | POA: Diagnosis present

## 2023-06-27 NOTE — Assessment & Plan Note (Addendum)
Declined treatment, aware of risks

## 2023-06-27 NOTE — Progress Notes (Signed)
BP (!) 162/94   Pulse 80   Temp 98.2 F (36.8 C) (Oral)   Resp 18   Ht 5\' 10"  (1.778 m)   Wt 188 lb 8 oz (85.5 kg)   SpO2 98%   BMI 27.05 kg/m    Subjective:    Patient ID: Joel Waller, male    DOB: 10-26-1986, 37 y.o.   MRN: [id number]  HPI: Joel Waller is a 37 y.o. male  Chief Complaint  Patient presents with   Exposure to STD   Hypertension:  -Medications: uncontrolled, prescribed lisinopril but not taking it.  -patient reports he is not going to take blood pressure medication Patient reports he is aware of the risk he is taking and is not interested in discussing it further -Checking BP at home (average): not checking blood pressure at home -Denies any SOB, CP, vision changes, LE edema or symptoms of hypotension -Diet: recommend DASH diet -Exercise: increase physical activity as tolerated    06/27/2023    1:56 PM 06/16/2023    4:17 PM 02/19/2023   12:14 PM  Vitals with BMI  Height 5\' 10"     Weight 188 lbs 8 oz 192 lbs 3 oz   BMI 27.05    Systolic 162 176 409  Diastolic 94 107 107  Pulse 80 70 78     Std testing: patient was recently exposed and treated for gonorrhea.  Patient was seen on 06/16/2023 treated on 06/17/2023. He reports he is no longer having symptoms and would like to be retested to make sure he no longer has it. Patient reports he is concerned that he might have gonorrhea in his throat. He denies any sore throat. Exam was normal. Will get a swab and send to lab.  Relevant past medical, surgical, family and social history reviewed and updated as indicated. Interim medical history since our last visit reviewed. Allergies and medications reviewed and updated.  Review of Systems Constitutional: Negative for fever or weight change.  Respiratory: Negative for cough and shortness of breath.   Cardiovascular: Negative for chest pain or palpitations.  Gastrointestinal: Negative for abdominal pain, no bowel changes.  Musculoskeletal: Negative for gait  problem or joint swelling.  Skin: Negative for rash.  Neurological: Negative for dizziness or headache.  No other specific complaints in a complete review of systems (except as listed in HPI above).      Objective:    BP (!) 162/94   Pulse 80   Temp 98.2 F (36.8 C) (Oral)   Resp 18   Ht 5\' 10"  (1.778 m)   Wt 188 lb 8 oz (85.5 kg)   SpO2 98%   BMI 27.05 kg/m   Wt Readings from Last 3 Encounters:  06/27/23 188 lb 8 oz (85.5 kg)  06/16/23 192 lb 3.2 oz (87.2 kg)  02/19/23 200 lb (90.7 kg)    Physical Exam  Constitutional: Patient appears well-developed and well-nourished.  No distress.  HEENT: head atraumatic, normocephalic, pupils equal and reactive to light, neck supple, throat within normal limits Cardiovascular: Normal rate, regular rhythm and normal heart sounds.  No murmur heard. No BLE edema. Pulmonary/Chest: Effort normal and breath sounds normal. No respiratory distress. Abdominal: Soft.  There is no tenderness. Psychiatric: Patient has a normal mood and affect. behavior is normal. Judgment and thought content normal.  Results for orders placed or performed in visit on 06/16/23  GC/Chlamydia Probe Amp   Specimen: Urine   UR  Result Value Ref Range   Chlamydia trachomatis,  NAA Negative Negative   Neisseria Gonorrhoeae by PCR Positive (A) Negative  Treponemal Antibodies, TPPA  Result Value Ref Range   Treponemal Antibodies, TPPA Non Reactive Non Reactive   Interpretation: Comment   RPR w/reflex to TrepSure  Result Value Ref Range   RPR Non Reactive Non Reactive  HIV Antibody (routine testing w rflx)  Result Value Ref Range   HIV Screen 4th Generation wRfx Non Reactive Non Reactive      Assessment & Plan:   Problem List Items Addressed This Visit       Cardiovascular and Mediastinum   Hypertension - Primary    Declined treatment, aware of risks      Other Visit Diagnoses     Exposure to STD       labs ordered   Relevant Orders   Urine cytology  ancillary only   Cervicovaginal ancillary only        Follow up plan: Return if symptoms worsen or fail to improve.

## 2023-06-29 LAB — CERVICOVAGINAL ANCILLARY ONLY
Chlamydia: NEGATIVE
Comment: NEGATIVE
Comment: NORMAL
Neisseria Gonorrhea: NEGATIVE

## 2023-06-29 LAB — URINE CYTOLOGY ANCILLARY ONLY
Chlamydia: NEGATIVE
Comment: NEGATIVE
Comment: NORMAL
Neisseria Gonorrhea: NEGATIVE

## 2023-12-01 NOTE — Progress Notes (Unsigned)
   Acute Office Visit  Subjective:     Patient ID: Joel Waller, male    DOB: Jul 23, 1986, 37 y.o.   MRN: [id number]  No chief complaint on file.   HPI Patient is in today for chest pain.  CHEST PAIN Time since onset: Duration:{Blank single:19197::"days","weeks","months"} Onset: {Blank single:19197::"sudden","gradual"} Quality: {Blank multiple:19196::"sharp","dull","aching","burning","cramping","ill-defined","itchy","pressure-like","pulling","shooting","sore","stabbing","tender","tearing","throbbing"} Severity: {Blank single:19197::"mild","moderate","severe","1/10","2/10","3/10","4/10","5/10","6/10","7/10","8/10","9/10","10/10"} Location: {Blank single:19197::"substernal","left para substernal","right para substernal","subxiphoid","upper right","upper left","upper sternum"} Radiation: {Blank single:19197::"none","neck","jaw","left arm","right arm","back"} Episode duration:  Frequency: {Blank single:19197::"constant","intermittent","occasional","rare","every few minutes","a few times a hour","a few times a day","a few times a week","a few times a month","a few times a year"} Related to exertion: {Blank single:19197::"yes","no"} Activity when pain started:  Trauma: {Blank single:19197::"yes","no"} Anxiety/recent stressors: {Blank single:19197::"yes","no"} Aggravating factors:  Alleviating factors:  Status: {Blank multiple:19196::"better","worse","stable","fluctuating"} Treatments attempted: {Blank multiple:19196::"nothing","APAP","ibuprofen","antacids"}  Current pain status: {Blank single:19197::"pain free","in pain","chest wall tender"} Shortness of breath: {Blank single:19197::"yes","no"} Cough: {Blank single:19197::"yes","no","yes, productive","yes, non-productive"} Nausea: {Blank single:19197::"yes","no"} Diaphoresis: {Blank single:19197::"yes","no"} Heartburn: {Blank single:19197::"yes","no"} Palpitations: {Blank single:19197::"yes","no"}   ROS      Objective:    There  were no vitals taken for this visit. {Vitals History (Optional):23777}  Physical Exam  No results found for any visits on 12/02/23.      Assessment & Plan:   Problem List Items Addressed This Visit   None   No orders of the defined types were placed in this encounter.   No follow-ups on file.  Margarita Mail, DO

## 2023-12-02 ENCOUNTER — Ambulatory Visit: Payer: Managed Care, Other (non HMO) | Admitting: Internal Medicine

## 2024-01-22 ENCOUNTER — Other Ambulatory Visit: Payer: Self-pay

## 2024-01-22 ENCOUNTER — Encounter: Payer: Self-pay | Admitting: Emergency Medicine

## 2024-01-22 ENCOUNTER — Emergency Department
Admission: EM | Admit: 2024-01-22 | Discharge: 2024-01-22 | Disposition: A | Discharge status: Home / Self Care | Payer: Managed Care, Other (non HMO) | Attending: Emergency Medicine | Admitting: Emergency Medicine

## 2024-01-22 DIAGNOSIS — I10 Essential (primary) hypertension: Secondary | ICD-10-CM | POA: Diagnosis not present

## 2024-01-22 DIAGNOSIS — R509 Fever, unspecified: Secondary | ICD-10-CM | POA: Insufficient documentation

## 2024-01-22 DIAGNOSIS — R6889 Other general symptoms and signs: Secondary | ICD-10-CM

## 2024-01-22 DIAGNOSIS — K0889 Other specified disorders of teeth and supporting structures: Secondary | ICD-10-CM | POA: Insufficient documentation

## 2024-01-22 DIAGNOSIS — R1110 Vomiting, unspecified: Secondary | ICD-10-CM | POA: Diagnosis not present

## 2024-01-22 DIAGNOSIS — Z20822 Contact with and (suspected) exposure to covid-19: Secondary | ICD-10-CM | POA: Diagnosis not present

## 2024-01-22 LAB — RESP PANEL BY RT-PCR (RSV, FLU A&B, COVID)  RVPGX2
Influenza A by PCR: NEGATIVE
Influenza B by PCR: NEGATIVE
Resp Syncytial Virus by PCR: NEGATIVE
SARS Coronavirus 2 by RT PCR: NEGATIVE

## 2024-01-22 MED ORDER — LIDOCAINE-EPINEPHRINE 2 %-1:100000 IJ SOLN
1.7000 mL | Freq: Once | INTRAMUSCULAR | Status: DC
  Filled 2024-01-22: qty 1

## 2024-01-22 MED ORDER — TRAMADOL HCL 50 MG PO TABS: 50.0000 mg | ORAL_TABLET | Freq: Four times a day (QID) | ORAL | 0 refills | Status: DC | PRN

## 2024-01-22 MED ORDER — AMOXICILLIN 500 MG PO CAPS: 500.0000 mg | ORAL_CAPSULE | Freq: Three times a day (TID) | ORAL | 0 refills | Status: DC

## 2024-01-22 MED ORDER — IBUPROFEN 600 MG PO TABS: 600.0000 mg | ORAL_TABLET | Freq: Four times a day (QID) | ORAL | 0 refills | Status: DC | PRN

## 2024-01-22 NOTE — ED Triage Notes (Signed)
Pt via POV from home. Pt c/o R lower dental pain that started

## 2024-01-22 NOTE — ED Provider Notes (Signed)
[facility] Provider Note    Event Date/Time   First MD Initiated Contact with Patient 01/22/24 1428     (approximate)   History   Dental Pain   HPI  Joel Waller is a 38 y.o. male with history of hypertension presents emergency department complaining of right sided dental pain, also having some bodyaches chills and fever.  Feels like may be flu symptoms.  Positive for a few episodes of vomiting today.      Physical Exam   Triage Vital Signs: ED Triage Vitals  Encounter Vitals Group     BP 01/22/24 1257 (!) 186/119     Systolic BP Percentile --      Diastolic BP Percentile --      Pulse Rate 01/22/24 1257 71     Resp 01/22/24 1257 20     Temp 01/22/24 1257 98.7 F (37.1 C)     Temp Source 01/22/24 1257 Oral     SpO2 01/22/24 1257 100 %     Weight 01/22/24 1246 187 lb 6.3 oz (85 kg)     Height 01/22/24 1246 5\' 10"  (1.778 m)     Head Circumference --      Peak Flow --      Pain Score 01/22/24 1246 10     Pain Loc --      Pain Education --      Exclude from Growth Chart --     Most recent vital signs: Vitals:   01/22/24 1257  BP: (!) 186/119  Pulse: 71  Resp: 20  Temp: 98.7 F (37.1 C)  SpO2: 100%     General: Awake, no distress.   CV:  Good peripheral perfusion. regular rate and  rhythm Resp:  Normal effort. Lungs cta Abd:  No distention.   Other:  Right side of the lower molars are tender to palpation, no obvious swelling or abscess   ED Results / Procedures / Treatments   Labs (all labs ordered are listed, but only abnormal results are displayed) Labs Reviewed  RESP PANEL BY RT-PCR (RSV, FLU A&B, COVID)  [id number]     EKG     RADIOLOGY     PROCEDURES:   Dental Block  Date/Time: 01/22/2024 2:53 PM  Performed by: Faythe Ghee, PA-C Authorized by: Faythe Ghee, PA-C   Consent:    Consent obtained:  Verbal   Consent given by:  Patient   Risks, benefits, and alternatives were discussed: yes      Risks discussed:  Allergic reaction, infection, nerve damage, swelling, hematoma, intravascular injection, pain and unsuccessful block   Alternatives discussed:  No treatment Universal protocol:    Procedure explained and questions answered to patient or proxy's satisfaction: yes     Immediately prior to procedure, a time out was called: yes     Patient identity confirmed:  Verbally with patient Indications:    Indications: dental pain   Location:    Block type:  Inferior alveolar   Laterality:  Right Procedure details:    Syringe type:  Aspirating dental syringe   Needle gauge:  30 G   Anesthetic injected:  Lidocaine 2% WITH epi   Injection procedure:  Anatomic landmarks identified, introduced needle, incremental injection, anatomic landmarks palpated and negative aspiration for blood Post-procedure details:    Outcome:  Anesthesia achieved   Procedure completion:  Tolerated well, no immediate complications  Chief Complaint  Patient presents with   Dental Pain  MEDICATIONS ORDERED IN ED: Medications  lidocaine-EPINEPHrine (XYLOCAINE W/EPI) 2 %-1:100000 (with pres) injection 1.7 mL (has no administration in time range)     IMPRESSION / MDM / ASSESSMENT AND PLAN / ED COURSE  I reviewed the triage vital signs and the nursing notes.                              Differential diagnosis includes, but is not limited to, dental pain, dental abscess, COVID, influenza, RSV  Patient's presentation is most consistent with acute illness / injury with system symptoms.   Respiratory panel ordered  Dental block was performed, patient was given prescription for amoxicillin, ibuprofen and tramadol.  He is to follow-up with his regular doctor for the flu symptoms.  Follow-up with a dentist for his tooth pain.  Patient is in agreement treatment plan.  He was discharged stable condition.  He is going to review his flu test on [facility].      FINAL CLINICAL IMPRESSION(S) / ED  DIAGNOSES   Final diagnoses:  Pain, dental  Flu-like symptoms     Rx / DC Orders   ED Discharge Orders          Ordered    amoxicillin (AMOXIL) 500 MG capsule  3 times daily        01/22/24 1450    ibuprofen (ADVIL) 600 MG tablet  Every 6 hours PRN        01/22/24 1450    traMADol (ULTRAM) 50 MG tablet  Every 6 hours PRN        01/22/24 1450             Note:  This document was prepared using Dragon voice recognition software and may include unintentional dictation errors.    Faythe Ghee, PA-C 01/22/24 1453    Minna Antis, MD 01/23/24 2126

## 2024-01-22 NOTE — Discharge Instructions (Signed)
Take medication as prescribed.  Call the dentist for reevaluation. If your flu test is positive you should remain out of work until you have not had a fever for 24 to 48 hours.  Return emergency department worsening

## 2024-01-28 ENCOUNTER — Emergency Department
Admission: EM | Admit: 2024-01-28 | Discharge: 2024-01-28 | Disposition: A | Discharge status: Home / Self Care | Payer: Managed Care, Other (non HMO) | Attending: Emergency Medicine | Admitting: Emergency Medicine

## 2024-01-28 ENCOUNTER — Other Ambulatory Visit: Payer: Self-pay

## 2024-01-28 DIAGNOSIS — K0889 Other specified disorders of teeth and supporting structures: Secondary | ICD-10-CM | POA: Diagnosis present

## 2024-01-28 DIAGNOSIS — I10 Essential (primary) hypertension: Secondary | ICD-10-CM | POA: Diagnosis not present

## 2024-01-28 MED ORDER — AMLODIPINE BESYLATE 5 MG PO TABS: 5.0000 mg | ORAL_TABLET | Freq: Every day | ORAL | 2 refills | Status: DC

## 2024-01-28 MED ORDER — OXYCODONE HCL 5 MG PO TABS: 5.0000 mg | ORAL_TABLET | Freq: Three times a day (TID) | ORAL | 0 refills | Status: DC | PRN

## 2024-01-28 NOTE — ED Provider Notes (Signed)
[facility] Provider Note    Event Date/Time   First MD Initiated Contact with Patient 01/28/24 1229     (approximate)   History   Dental Pain   HPI  Joel Waller is a 38 y.o. male with a past medical history of hypertension who presents today for evaluation of dental pain.  Patient reports that he had a tooth extracted 2 days ago but he was not given any pain medication.  He reports that ibuprofen and Tylenol are not sufficing.  He denies any facial or neck swelling, no difficulty opening his mouth, no difficulty swallowing.  He also reports that he has not been taking his blood pressure medication for the last 1 to 2 months and reports that he needs a refill.  He denies headache, blurry vision, chest pain, shortness of breath, weakness, paresthesias.  Patient Active Problem List   Diagnosis Date Noted   Hypertension 11/02/2022          Physical Exam   Triage Vital Signs: ED Triage Vitals  Encounter Vitals Group     BP 01/28/24 1210 (!) 200/122     Systolic BP Percentile --      Diastolic BP Percentile --      Pulse Rate 01/28/24 1210 60     Resp 01/28/24 1210 (!) 24     Temp 01/28/24 1210 98 F (36.7 C)     Temp Source 01/28/24 1210 Oral     SpO2 01/28/24 1210 100 %     Weight 01/28/24 1208 200 lb (90.7 kg)     Height 01/28/24 1208 5\' 10"  (1.778 m)     Head Circumference --      Peak Flow --      Pain Score 01/28/24 1208 10     Pain Loc --      Pain Education --      Exclude from Growth Chart --     Most recent vital signs: Vitals:   01/28/24 1210  BP: (!) 200/122  Pulse: 60  Resp: (!) 24  Temp: 98 F (36.7 C)  SpO2: 100%    Physical Exam Vitals and nursing note reviewed.  Constitutional:      General: Awake and alert. No acute distress.    Appearance: Normal appearance. The patient is normal weight.  HENT:     Head: Normocephalic and atraumatic.     Mouth: Mucous membranes are moist.  Tooth #30 missing, no fluctuance  noted.  No active bleeding.  No sublingual swelling.  No facial or neck swelling or erythema.  No trismus.  No drooling Eyes:     General: PERRL. Normal EOMs        Right eye: No discharge.        Left eye: No discharge.     Conjunctiva/sclera: Conjunctivae normal.  Cardiovascular:     Rate and Rhythm: Normal rate and regular rhythm.     Pulses: Normal pulses.  Pulmonary:     Effort: Pulmonary effort is normal. No respiratory distress.     Breath sounds: Normal breath sounds.  Abdominal:     Abdomen is soft. There is no abdominal tenderness. No rebound or guarding. No distention. Musculoskeletal:        General: No swelling. Normal range of motion.     Cervical back: Normal range of motion and neck supple.  Skin:    General: Skin is warm and dry.     Capillary Refill: Capillary refill takes less than 2  seconds.     Findings: No rash.  Neurological:     Mental Status: The patient is awake and alert.      ED Results / Procedures / Treatments   Labs (all labs ordered are listed, but only abnormal results are displayed) Labs Reviewed - No data to display   EKG     RADIOLOGY     PROCEDURES:  Critical Care performed:   Procedures   MEDICATIONS ORDERED IN ED: Medications - No data to display   IMPRESSION / MDM / ASSESSMENT AND PLAN / ED COURSE  I reviewed the triage vital signs and the nursing notes.   Differential diagnosis includes, but is not limited to, postextraction pain, dental pain, dry socket, abscess, pulpitis.  Patient is awake and alert, hypertensive on arrival, though nontoxic in appearance.  He is afebrile.  I reviewed the patient's chart.  Patient was seen in the emergency department 01/22/2024 for dental pain.  No gingival swelling or fluctuance concerning for gingival abscess.  No trismus, nuchal rigidity, neck pain, hot potato voice, uvular deviation or malocclusion to suggest deep space infection. No sublingual swelling concerning for  Ludwig's angina.  Patient was already started on antibiotics during previous visit.  He was given a prescription for oxycodone for breakthrough pain, though advised that this medication is highly addictive and should not be used with tramadol, benzodiazepines, or other sedating medications.  Also advised that he cannot drive, operate heavy machinery, or perform any test that require concentration while taking this medication.    As for his blood pressure, patient reports that he needs a refill of his blood pressure medication.  This was prescribed for him.  He does not currently have any headache, visual changes, chest pain, shortness of breath, it appears that he has asymptomatic hypertension and therefore will not urgently/emergently drop his blood pressure in the emergency department.  We did discuss the importance of close outpatient follow-up with PCP for blood pressure management which patient agrees to do.    Discussed care plan, return precautions, and advised close outpatient follow-up with his dentist. Patient agrees with plan of care.    Patient's presentation is most consistent with severe exacerbation of chronic illness.    FINAL CLINICAL IMPRESSION(S) / ED DIAGNOSES   Final diagnoses:  Pain, dental  Hypertension, unspecified type     Rx / DC Orders   ED Discharge Orders          Ordered    amLODipine (NORVASC) 5 MG tablet  Daily        01/28/24 1231    oxyCODONE (ROXICODONE) 5 MG immediate release tablet  Every 8 hours PRN        01/28/24 1231             Note:  This document was prepared using Dragon voice recognition software and may include unintentional dictation errors.   Keturah Shavers 01/28/24 1511    Jene Every, MD 01/29/24 1124

## 2024-01-28 NOTE — Discharge Instructions (Addendum)
Please follow up with your dentist. Remember that oxycodone is highly addictive and should only be used for breakthrough pain.  I will remember that you cannot take this with tramadol, or any other narcotics or any other sedating medications.  You cannot drive, operate heavy machinery, or perform any test that require concentration while taking this medicine.  Also remember that you should not exceed 4 g of Tylenol in 24 hours.  Please follow all package instructions and do not take more than prescribed or than written on the package.  Also remember to follow-up with your primary care provider for your blood pressure.  Your medication was refilled for you for your blood pressure as well.  Please return for any new, worsening, or change in symptoms or other concerns.  It was a pleasure caring for you today.

## 2024-01-28 NOTE — ED Triage Notes (Signed)
Pt to ED for R lower jaw pain, had tooth extracted yesterday, states was not prescribed any pain meds. Has been taking tylenol PM "4-5 pills" every 2 hours. Counseled to not take over 4g tylenol in 24hr. Pt states he is here basically for pain control.   Hypertensive in triage, took BP twice. States does not take BP meds as supposed to.

## 2024-01-28 NOTE — ED Notes (Signed)
Pt is being discharged from triage with prescriptions. Will d/c pt once papers printed.

## 2024-05-25 ENCOUNTER — Ambulatory Visit: Admitting: Internal Medicine

## 2024-05-25 ENCOUNTER — Other Ambulatory Visit: Payer: Self-pay

## 2024-05-25 ENCOUNTER — Encounter: Payer: Self-pay | Admitting: Internal Medicine

## 2024-05-25 VITALS — BP 158/92 | HR 87 | Temp 98.2°F | Resp 16 | Ht 70.0 in | Wt 200.6 lb

## 2024-05-25 DIAGNOSIS — I10 Essential (primary) hypertension: Secondary | ICD-10-CM | POA: Diagnosis not present

## 2024-05-25 DIAGNOSIS — L739 Follicular disorder, unspecified: Secondary | ICD-10-CM

## 2024-05-25 DIAGNOSIS — Z9109 Other allergy status, other than to drugs and biological substances: Secondary | ICD-10-CM

## 2024-05-25 DIAGNOSIS — R079 Chest pain, unspecified: Secondary | ICD-10-CM

## 2024-05-25 MED ORDER — CLINDAMYCIN PHOS (ONCE-DAILY) 1 % EX GEL: 1.0000 | Freq: Every day | CUTANEOUS | 0 refills | Status: AC | PRN

## 2024-05-25 NOTE — Progress Notes (Signed)
 Acute Office Visit  Subjective:     Patient ID: Joel Waller, male    DOB: 10-Apr-1986, 38 y.o.   MRN: [id number]  Chief Complaint  Patient presents with   Mass    Bump in groin area (hair bump) irritated  for 1 weeks     HPI Patient is in today for bump in groin.  Discussed the use of AI scribe software for clinical note transcription with the patient, who gave verbal consent to proceed.  History of Present Illness Joel Waller is a 38 year old male who presents with a lump in the groin area.  He noticed the lump approximately one week ago. It is small and came to a head last night with minimal drainage, though he is unsure if it was clear or pus-like. The lump remains with no significant change in size. There is no fever, pain, or other skin changes associated with it.  He experiences chest pain related to repetitive motions at work, which sometimes radiates to the middle of his back. The pain is not reproducible by pressing on his chest.    Review of Systems  All other systems reviewed and are negative.       Objective:    BP (!) 144/86 (Cuff Size: Large)   Pulse 87   Temp 98.2 F (36.8 C) (Oral)   Resp 16   Ht 5\' 10"  (1.778 m)   Wt 200 lb 9.6 oz (91 kg)   SpO2 98%   BMI 28.78 kg/m    Physical Exam Constitutional:      Appearance: Normal appearance.  HENT:     Head: Normocephalic and atraumatic.  Cardiovascular:     Rate and Rhythm: Normal rate and regular rhythm.  Pulmonary:     Effort: Pulmonary effort is normal.     Breath sounds: Normal breath sounds.  Skin:    General: Skin is warm and dry.     Comments: Small enlarged hair follicle on left testicle, no drainage or erythema   Neurological:     General: No focal deficit present.     Mental Status: He is alert. Mental status is at baseline.  Psychiatric:        Mood and Affect: Mood normal.        Behavior: Behavior normal.     No results found for any visits on 05/25/24.       Assessment & Plan:   Assessment & Plan Chest pain, unspecified Intermittent chest pain, likely musculoskeletal, associated with repetitive motion. No exertional component. EKG needed to rule out cardiac causes. - Perform EKG - normal sinus rhythm, no changes compared to EKG from 2019. - Schedule follow-up in July for physical examination, labs, and re-evaluation of chest pain.  Folliculitis Small groin lump, likely inflamed hair follicle. Clindamycin  gel recommended for minimal side effects. - Prescribe clindamycin  gel for topical use once daily for five days. - Instruct to monitor for signs of infection such as increased redness, pus drainage, or fever and report if these occur. - Advise to retain clindamycin  gel for future similar occurrences.  Environmental allergies Environmental allergies, primarily outdoors. Zyrtec recommended as strongest OTC option. Consistent use may improve symptoms. - Advise to take Zyrtec daily during allergy season to manage symptoms. - Consider Singulair if symptoms persist despite daily Zyrtec use.  HTN - Recheck at follow up.  - Clindamycin  Phos, Once-Daily, (CLINDAGEL) 1 % GEL; Apply 1 Application topically daily as needed (folliculitis).  Dispense: 75 mL; Refill: 0 -  EKG 12-Lead   Return in about 6 weeks (around 07/06/2024).  Rockney Cid, DO

## 2024-07-06 ENCOUNTER — Ambulatory Visit: Admitting: Internal Medicine

## 2024-07-06 ENCOUNTER — Encounter: Payer: Self-pay | Admitting: Internal Medicine

## 2024-07-06 VITALS — BP 138/84 | HR 72 | Temp 97.8°F | Resp 18 | Ht 70.0 in | Wt 197.9 lb

## 2024-07-06 DIAGNOSIS — I10 Essential (primary) hypertension: Secondary | ICD-10-CM

## 2024-07-06 DIAGNOSIS — Z13220 Encounter for screening for lipoid disorders: Secondary | ICD-10-CM

## 2024-07-06 DIAGNOSIS — Z23 Encounter for immunization: Secondary | ICD-10-CM | POA: Diagnosis not present

## 2024-07-06 MED ORDER — AMLODIPINE BESYLATE 5 MG PO TABS: 5.0000 mg | ORAL_TABLET | Freq: Every day | ORAL | 5 refills | Status: DC

## 2024-07-06 NOTE — Progress Notes (Signed)
 Established Patient Office Visit  Subjective    Patient ID: Joel Waller, male    DOB: 1986-11-23  Age: 38 y.o. MRN: [id number]  CC:  Chief Complaint  Patient presents with   Follow-up    6 week recheck    HPI Joel Waller presents to follow up on chronic medical conditions.   Discussed the use of AI scribe software for clinical note transcription with the patient, who gave verbal consent to proceed.  History of Present Illness Joel Waller is a 38 year old male with hypertension who presents for a follow-up visit.  His blood pressure today is 138/84 mmHg. He was off amlodipine  for a couple of days due to running out of medication and noticed the effects. His last recorded blood pressure was 144/86 mmHg, indicating some improvement. He does not monitor his blood pressure at home. He is currently taking amlodipine  5 mg daily.    Hypertension: -Medications: Amlodipine  5 mg -Patient is compliant with above medications and reports no side effects. -Checking BP at home (average): Not checking, does have a cuff at home -No longer taking Lisinopril  but stopped due to side effects -Denies any SOB, CP, vision changes, LE edema or symptoms of hypotension.    History of Asthma: -Diagnosed in childhood -Using Albuterol  occasionally, had a URI a few weeks ago but normally does not have to use inhaler  Health Maintenance: -Blood work due -HPV vaccines due   Outpatient Encounter Medications as of 07/06/2024  Medication Sig   amLODipine  (NORVASC ) 5 MG tablet Take 1 tablet (5 mg total) by mouth daily.   Clindamycin  Phos, Once-Daily, (CLINDAGEL) 1 % GEL Apply 1 Application topically daily as needed (folliculitis).   amoxicillin  (AMOXIL ) 500 MG capsule Take 1 capsule (500 mg total) by mouth 3 (three) times daily. (Patient not taking: Reported on 07/06/2024)   ibuprofen  (ADVIL ) 600 MG tablet Take 1 tablet (600 mg total) by mouth every 6 (six) hours as needed. (Patient not taking:  Reported on 07/06/2024)   oxyCODONE  (ROXICODONE ) 5 MG immediate release tablet Take 1 tablet (5 mg total) by mouth every 8 (eight) hours as needed. (Patient not taking: Reported on 07/06/2024)   traMADol  (ULTRAM ) 50 MG tablet Take 1 tablet (50 mg total) by mouth every 6 (six) hours as needed. (Patient not taking: Reported on 07/06/2024)   No facility-administered encounter medications on file as of 07/06/2024.    Past Medical History:  Diagnosis Date   Asthma    Hypertension     Past Surgical History:  Procedure Laterality Date   CYST EXCISION     face    Family History  Problem Relation Age of Onset   Hypertension Mother    Arthritis Mother    HIV Brother    Bronchitis Son     Social History   Socioeconomic History   Marital status: Married    Spouse name: Not on file   Number of children: Not on file   Years of education: Not on file   Highest education level: Not on file  Occupational History   Not on file  Tobacco Use   Smoking status: Every Day    Current packs/day: 1.00    Average packs/day: 1 pack/day for 10.4 years (10.4 ttl pk-yrs)    Types: Cigarettes    Start date: 01/27/2014   Smokeless tobacco: Never  Vaping Use   Vaping status: Never Used  Substance and Sexual Activity   Alcohol use: No   Drug use: No  Sexual activity: Yes  Other Topics Concern   Not on file  Social History Narrative   Not on file   Social Drivers of Health   Financial Resource Strain: Not on file  Food Insecurity: Not on file  Transportation Needs: Not on file  Physical Activity: Not on file  Stress: Not on file  Social Connections: Not on file  Intimate Partner Violence: Not on file    Review of Systems  All other systems reviewed and are negative.     Objective    BP 138/84 (Cuff Size: Large)   Pulse 72   Temp 97.8 F (36.6 C) (Oral)   Resp 18   Ht 5' 10 (1.778 m)   Wt 197 lb 14.4 oz (89.8 kg)   SpO2 98%   BMI 28.40 kg/m   Physical Exam Constitutional:       Appearance: Normal appearance.  HENT:     Head: Normocephalic and atraumatic.  Eyes:     Conjunctiva/sclera: Conjunctivae normal.  Cardiovascular:     Rate and Rhythm: Normal rate and regular rhythm.  Pulmonary:     Effort: Pulmonary effort is normal.     Breath sounds: Normal breath sounds.  Musculoskeletal:     Right lower leg: No edema.     Left lower leg: No edema.  Skin:    General: Skin is warm and dry.  Neurological:     General: No focal deficit present.     Mental Status: He is alert. Mental status is at baseline.  Psychiatric:        Mood and Affect: Mood normal.        Behavior: Behavior normal.        Assessment & Plan:   Assessment & Plan Hypertension Blood pressure borderline controlled at 138/84 mmHg. Amlodipine  lapse may have affected control. Target is <140/90 mmHg. - Refilled amlodipine  5 mg - Advised home blood pressure monitoring twice weekly. - Instructed to report if blood pressure >140/90 mmHg.  General Health Maintenance Up to date on vaccinations except HPV. Gardasil recommended to reduce HPV-related cancer risk. - Administered first dose of Gardasil. - Scheduled nurse visit in one month for second dose of Gardasil. - Plan third dose of Gardasil in six months. - Ordered routine blood work for renal function, liver function, electrolytes, anemia, and cholesterol.  - HPV 9-valent vaccine,Recombinat - CBC w/Diff/Platelet - Comprehensive Metabolic Panel (CMET) - amLODipine  (NORVASC ) 5 MG tablet; Take 1 tablet (5 mg total) by mouth daily.  Dispense: 30 tablet; Refill: 5 - Lipid Profile   Return in about 6 months (around 01/06/2025) for 6 months medical visit, 30 days from now for nurse's visit for second HPV vaccine.   Sharyle Fischer, DO

## 2024-07-07 LAB — COMPREHENSIVE METABOLIC PANEL WITH GFR
AG Ratio: 1.5 (calc) (ref 1.0–2.5)
ALT: 16 U/L (ref 9–46)
AST: 16 U/L (ref 10–40)
Albumin: 4.6 g/dL (ref 3.6–5.1)
Alkaline phosphatase (APISO): 45 U/L (ref 36–130)
BUN/Creatinine Ratio: 12 (calc) (ref 6–22)
BUN: 18 mg/dL (ref 7–25)
CO2: 27 mmol/L (ref 20–32)
Calcium: 9.7 mg/dL (ref 8.6–10.3)
Chloride: 105 mmol/L (ref 98–110)
Creat: 1.53 mg/dL — ABNORMAL HIGH (ref 0.60–1.26)
Globulin: 3.1 g/dL (ref 1.9–3.7)
Glucose, Bld: 85 mg/dL (ref 65–99)
Potassium: 4.3 mmol/L (ref 3.5–5.3)
Sodium: 139 mmol/L (ref 135–146)
Total Bilirubin: 0.9 mg/dL (ref 0.2–1.2)
Total Protein: 7.7 g/dL (ref 6.1–8.1)
eGFR: 59 mL/min/1.73m2 — ABNORMAL LOW

## 2024-07-07 LAB — CBC WITH DIFFERENTIAL/PLATELET
Absolute Lymphocytes: 1932 {cells}/uL (ref 850–3900)
Absolute Monocytes: 507 {cells}/uL (ref 200–950)
Basophils Absolute: 40 {cells}/uL (ref 0–200)
Basophils Relative: 0.7 %
Eosinophils Absolute: 331 {cells}/uL (ref 15–500)
Eosinophils Relative: 5.8 %
HCT: 44.3 % (ref 38.5–50.0)
Hemoglobin: 14.2 g/dL (ref 13.2–17.1)
MCH: 28.6 pg (ref 27.0–33.0)
MCHC: 32.1 g/dL (ref 32.0–36.0)
MCV: 89.3 fL (ref 80.0–100.0)
MPV: 11.1 fL (ref 7.5–12.5)
Monocytes Relative: 8.9 %
Neutro Abs: 2890 {cells}/uL (ref 1500–7800)
Neutrophils Relative %: 50.7 %
Platelets: 152 Thousand/uL (ref 140–400)
RBC: 4.96 Million/uL (ref 4.20–5.80)
RDW: 12.7 % (ref 11.0–15.0)
Total Lymphocyte: 33.9 %
WBC: 5.7 Thousand/uL (ref 3.8–10.8)

## 2024-07-07 LAB — LIPID PANEL
Cholesterol: 116 mg/dL (ref <200)
HDL: 53 mg/dL (ref >=40)
LDL Cholesterol (Calc): 49 mg/dL
Non-HDL Cholesterol (Calc): 63 mg/dL (ref <130)
Total CHOL/HDL Ratio: 2.2 (calc) (ref <5.0)
Triglycerides: 65 mg/dL (ref <150)

## 2024-07-09 ENCOUNTER — Ambulatory Visit: Payer: Self-pay | Admitting: Internal Medicine

## 2024-08-07 ENCOUNTER — Ambulatory Visit

## 2024-08-14 ENCOUNTER — Ambulatory Visit (INDEPENDENT_AMBULATORY_CARE_PROVIDER_SITE_OTHER)

## 2024-08-14 DIAGNOSIS — Z23 Encounter for immunization: Secondary | ICD-10-CM

## 2024-08-14 NOTE — Progress Notes (Signed)
 Patient is in office today for a nurse visit for HPV Immunization. Patient Injection was given in the  Left deltoid. Patient tolerated injection well.

## 2024-11-13 ENCOUNTER — Encounter: Payer: Self-pay | Admitting: Internal Medicine

## 2024-11-13 ENCOUNTER — Other Ambulatory Visit (HOSPITAL_COMMUNITY)
Admission: RE | Admit: 2024-11-13 | Discharge: 2024-11-13 | Disposition: A | Discharge status: Home / Self Care | Source: Ambulatory Visit | Attending: Internal Medicine | Admitting: Internal Medicine

## 2024-11-13 ENCOUNTER — Ambulatory Visit: Admitting: Internal Medicine

## 2024-11-13 ENCOUNTER — Other Ambulatory Visit: Payer: Self-pay

## 2024-11-13 VITALS — BP 130/86 | HR 86 | Temp 98.0°F | Resp 16 | Ht 70.0 in | Wt 208.2 lb

## 2024-11-13 DIAGNOSIS — Z113 Encounter for screening for infections with a predominantly sexual mode of transmission: Secondary | ICD-10-CM | POA: Insufficient documentation

## 2024-11-13 DIAGNOSIS — Z0000 Encounter for general adult medical examination without abnormal findings: Secondary | ICD-10-CM | POA: Insufficient documentation

## 2024-11-13 DIAGNOSIS — I10 Essential (primary) hypertension: Secondary | ICD-10-CM | POA: Diagnosis not present

## 2024-11-13 MED ORDER — AMLODIPINE BESYLATE 5 MG PO TABS: 5.0000 mg | ORAL_TABLET | Freq: Every day | ORAL | 1 refills | Status: AC

## 2024-11-13 NOTE — Progress Notes (Signed)
 Name: Joel Waller   MRN: [id number]    DOB: 1986-02-20   Date:11/13/2024       Progress Note  Subjective  Chief Complaint  Chief Complaint  Patient presents with   Annual Exam    HPI  Patient presents for annual CPE .  Discussed the use of AI scribe software for clinical note transcription with the patient, who gave verbal consent to proceed.  History of Present Illness Joel Waller is a 38 year old male who presents for follow-up on kidney function and STD screening.  He is following up on kidney function due to previous labs showing slightly elevated creatinine levels and a GFR just below 60. He suspects inadequate water intake may be a factor. He uses over-the-counter pain medications like ibuprofen  or Aleve a couple of times a month.  He does not receive flu vaccines and is scheduled for his last HPV vaccine in January. He maintains a regular medication regimen.   Diet: Regular Exercise: None Last Dental Exam: will schedule Last Eye Exam: will schedule  Depression: phq 9 is negative    11/13/2024    9:58 AM 06/27/2023    1:56 PM 12/16/2022    3:49 PM 11/02/2022   10:24 AM  Depression screen PHQ 2/9  Decreased Interest 0 0 0 0  Down, Depressed, Hopeless 0 0 0 0  PHQ - 2 Score 0 0 0 0  Altered sleeping   0 0  Tired, decreased energy   0 0  Change in appetite   0 0  Feeling bad or failure about yourself    0 0  Trouble concentrating   0 0  Moving slowly or fidgety/restless   0 0  Suicidal thoughts   0 0  PHQ-9 Score   0  0   Difficult doing work/chores   Not difficult at all Not difficult at all     Data saved with a previous flowsheet row definition    Hypertension:  BP Readings from Last 3 Encounters:  11/13/24 130/86  07/06/24 138/84  05/25/24 (!) 158/92    Obesity: Wt Readings from Last 3 Encounters:  11/13/24 208 lb 3.2 oz (94.4 kg)  07/06/24 197 lb 14.4 oz (89.8 kg)  05/25/24 200 lb 9.6 oz (91 kg)   BMI Readings from Last 3 Encounters:   11/13/24 29.87 kg/m  07/06/24 28.40 kg/m  05/25/24 28.78 kg/m     [employer] Visit from 11/13/2024 in [facility]  AUDIT-C Score 1     Married STD testing and prevention (HIV/chl/gon/syphilis):  yes Sexual history: active Hep C Screening: completed Skin cancer: Discussed monitoring for atypical lesions Colorectal cancer: NA, discussed starting screening at age 16 Prostate cancer:  not applicable No results found for: PSA   Lung cancer:  Low Dose CT Chest recommended if Age 70-80 years, 30 pack-year currently smoking OR have quit w/in 15years. Patient  is not a candidate for screening   AAA: The USPSTF recommends one-time screening with ultrasonography in men ages 49 to 75 years who have ever smoked. Patient   is not a candidate for screening  ECG:  05/25/3034  Vaccines: reviewed with the patient. Due for third HPV vaccine in January.   Advanced Care Planning: A voluntary discussion about advance care planning including the explanation and discussion of advance directives.  Discussed health care proxy and Living will, and the patient was able to identify a health care proxy as [name] (mother).  Patient does not  have a living will and power of attorney of health care   Patient Active Problem List   Diagnosis Date Noted   Hypertension 11/02/2022    Past Surgical History:  Procedure Laterality Date   CYST EXCISION     face    Family History  Problem Relation Age of Onset   Hypertension Mother    Arthritis Mother    HIV Brother    Bronchitis Son     Social History   Socioeconomic History   Marital status: Married    Spouse name: Not on file   Number of children: Not on file   Years of education: Not on file   Highest education level: Not on file  Occupational History   Not on file  Tobacco Use   Smoking status: Every Day    Current packs/day: 1.00    Average packs/day: 1 pack/day for 10.8 years (10.8 ttl pk-yrs)     Types: Cigarettes    Start date: 01/27/2014   Smokeless tobacco: Never  Vaping Use   Vaping status: Never Used  Substance and Sexual Activity   Alcohol use: No   Drug use: No   Sexual activity: Yes  Other Topics Concern   Not on file  Social History Narrative   Not on file   Social Drivers of Health   Financial Resource Strain: Low Risk  (11/13/2024)   Overall Financial Resource Strain (CARDIA)    Difficulty of Paying Living Expenses: Not hard at all  Food Insecurity: No Food Insecurity (11/13/2024)   Hunger Vital Sign    Worried About Running Out of Food in the Last Year: Never true    Ran Out of Food in the Last Year: Never true  Transportation Needs: No Transportation Needs (11/13/2024)   PRAPARE - Administrator, Civil Service (Medical): No    Lack of Transportation (Non-Medical): No  Physical Activity: Inactive (11/13/2024)   Exercise Vital Sign    Days of Exercise per Week: 0 days    Minutes of Exercise per Session: 0 min  Stress: No Stress Concern Present (11/13/2024)   [employer] of Occupational Health - Occupational Stress Questionnaire    Feeling of Stress: Only a little  Social Connections: Moderately Isolated (11/13/2024)   Social Connection and Isolation Panel    Frequency of Communication with Friends and Family: More than three times a week    Frequency of Social Gatherings with Friends and Family: Never    Attends Religious Services: 1 to 4 times per year    Active Member of [employer] or Organizations: No    Attends Banker Meetings: Never    Marital Status: Separated  Intimate Partner Violence: Not At Risk (11/13/2024)   Humiliation, Afraid, Rape, and Kick questionnaire    Fear of Current or Ex-Partner: No    Emotionally Abused: No    Physically Abused: No    Sexually Abused: No     Current Outpatient Medications:    amLODipine  (NORVASC ) 5 MG tablet, Take 1 tablet (5 mg total) by mouth daily., Disp: 30 tablet, Rfl: 5    Clindamycin  Phos, Once-Daily, (CLINDAGEL) 1 % GEL, Apply 1 Application topically daily as needed (folliculitis)., Disp: 75 mL, Rfl: 0  No Known Allergies   Review of Systems  All other systems reviewed and are negative.    Objective  Vitals:   11/13/24 1001  BP: 130/86  Pulse: 86  Resp: 16  Temp: 98 F (36.7 C)  TempSrc: Oral  SpO2: 99%  Weight: 208 lb 3.2 oz (94.4 kg)  Height: 5' 10 (1.778 m)    Body mass index is 29.87 kg/m.  Physical Exam Constitutional:      Appearance: Normal appearance.  HENT:     Head: Normocephalic and atraumatic.     Mouth/Throat:     Mouth: Mucous membranes are moist.     Pharynx: Oropharynx is clear.  Eyes:     Extraocular Movements: Extraocular movements intact.     Conjunctiva/sclera: Conjunctivae normal.     Pupils: Pupils are equal, round, and reactive to light.  Neck:     Comments: No thyromegaly Cardiovascular:     Rate and Rhythm: Normal rate and regular rhythm.  Pulmonary:     Effort: Pulmonary effort is normal.     Breath sounds: Normal breath sounds.  Musculoskeletal:     Cervical back: No tenderness.  Lymphadenopathy:     Cervical: No cervical adenopathy.  Skin:    General: Skin is warm and dry.  Neurological:     General: No focal deficit present.     Mental Status: He is alert. Mental status is at baseline.  Psychiatric:        Mood and Affect: Mood normal.        Behavior: Behavior normal.     Last CBC Lab Results  Component Value Date   WBC 5.7 07/06/2024   HGB 14.2 07/06/2024   HCT 44.3 07/06/2024   MCV 89.3 07/06/2024   MCH 28.6 07/06/2024   RDW 12.7 07/06/2024   PLT 152 07/06/2024   Last metabolic panel Lab Results  Component Value Date   GLUCOSE 85 07/06/2024   NA 139 07/06/2024   K 4.3 07/06/2024   CL 105 07/06/2024   CO2 27 07/06/2024   BUN 18 07/06/2024   CREATININE 1.53 (H) 07/06/2024   EGFR 59 (L) 07/06/2024   CALCIUM 9.7 07/06/2024   PROT 7.7 07/06/2024   BILITOT 0.9 07/06/2024    AST 16 07/06/2024   ALT 16 07/06/2024   ANIONGAP 9 12/05/2018   Last lipids Lab Results  Component Value Date   CHOL 116 07/06/2024   HDL 53 07/06/2024   LDLCALC 49 07/06/2024   TRIG 65 07/06/2024   CHOLHDL 2.2 07/06/2024   Last hemoglobin A1c No results found for: HGBA1C Last thyroid functions No results found for: TSH, T3TOTAL, T4TOTAL, FREET4, THYROIDAB Last vitamin D No results found for: 25OHVITD2, 25OHVITD3, VD25OH Last vitamin B12 and Folate No results found for: VITAMINB12, FOLATE    Assessment & Plan  Assessment & Plan Adult Wellness Visit Routine visit with slightly elevated kidney function likely due to dehydration. Blood pressure controlled. No immediate cancer screening concerns. HPV vaccine due in January. No flu vaccine received. - Ordered repeat kidney function tests to monitor trend. - Encouraged adequate hydration. - Discussed cancer screening guidelines and family history. - Will administer HPV vaccine in January. - Discussed flu vaccine.  Essential hypertension Hypertension well-controlled with current medication. Medication safe for kidney function. - Continue current antihypertensive medication. - Prescribed 90-day supply of medication for convenience.  Screening for sexually transmitted infections STD screening planned as part of routine health maintenance. - Ordered STD screening.  - Basic Metabolic Panel (BMET) - amLODipine  (NORVASC ) 5 MG tablet; Take 1 tablet (5 mg total) by mouth daily.  Dispense: 90 tablet; Refill: 1 - HIV antibody (with reflex) - RPR - Urine cytology ancillary only   -Prostate cancer screening and PSA options (with potential risks and  benefits of testing vs not testing) were discussed along with recent recs/guidelines. -USPSTF grade A and B recommendations reviewed with patient; age-appropriate recommendations, preventive care, screening tests, etc discussed and encouraged; healthy living  encouraged; see AVS for patient education given to patient -Discussed importance of 150 minutes of physical activity weekly, eat two servings of fish weekly, eat one serving of tree nuts ( cashews, pistachios, pecans, almonds.[email or web address]) every other day, eat 6 servings of fruit/vegetables daily and drink plenty of water and avoid sweet beverages.  -Reviewed Health Maintenance: yes

## 2024-11-14 LAB — RPR: RPR Ser Ql: NONREACTIVE

## 2024-11-14 LAB — URINE CYTOLOGY ANCILLARY ONLY
Chlamydia: NEGATIVE
Comment: NEGATIVE
Comment: NEGATIVE
Comment: NORMAL
Neisseria Gonorrhea: NEGATIVE
Trichomonas: NEGATIVE

## 2024-11-14 LAB — BASIC METABOLIC PANEL WITH GFR
BUN/Creatinine Ratio: 10 (calc) (ref 6–22)
BUN: 14 mg/dL (ref 7–25)
CO2: 28 mmol/L (ref 20–32)
Calcium: 9.4 mg/dL (ref 8.6–10.3)
Chloride: 104 mmol/L (ref 98–110)
Creat: 1.37 mg/dL — ABNORMAL HIGH (ref 0.60–1.26)
Glucose, Bld: 93 mg/dL (ref 65–99)
Potassium: 4.3 mmol/L (ref 3.5–5.3)
Sodium: 139 mmol/L (ref 135–146)
eGFR: 68 mL/min/1.73m2 (ref >=60)

## 2024-11-14 LAB — HIV ANTIBODY (ROUTINE TESTING W REFLEX)
HIV 1&2 Ab, 4th Generation: NONREACTIVE
HIV FINAL INTERPRETATION: NEGATIVE

## 2024-11-15 ENCOUNTER — Ambulatory Visit: Payer: Self-pay | Admitting: Internal Medicine

## 2024-11-19 ENCOUNTER — Encounter: Admitting: Internal Medicine

## 2025-01-07 ENCOUNTER — Ambulatory Visit: Admitting: Internal Medicine
